# Patient Record
Sex: Female | Born: 1955 | Race: White | Hispanic: No | Marital: Married | Smoking: Never smoker
Health system: Southern US, Community
[De-identification: ages and names within clinical notes are randomized; demographics above are authoritative.]

## PROBLEM LIST (undated history)

## (undated) DIAGNOSIS — F410 Panic disorder [episodic paroxysmal anxiety] without agoraphobia: Secondary | ICD-10-CM

## (undated) DIAGNOSIS — E7800 Pure hypercholesterolemia, unspecified: Secondary | ICD-10-CM

## (undated) DIAGNOSIS — K2970 Gastritis, unspecified, without bleeding: Secondary | ICD-10-CM

## (undated) DIAGNOSIS — K449 Diaphragmatic hernia without obstruction or gangrene: Secondary | ICD-10-CM

## (undated) DIAGNOSIS — G35 Multiple sclerosis: Secondary | ICD-10-CM

## (undated) DIAGNOSIS — K5190 Ulcerative colitis, unspecified, without complications: Secondary | ICD-10-CM

## (undated) DIAGNOSIS — K648 Other hemorrhoids: Secondary | ICD-10-CM

## (undated) DIAGNOSIS — E162 Hypoglycemia, unspecified: Secondary | ICD-10-CM

## (undated) DIAGNOSIS — E669 Obesity, unspecified: Secondary | ICD-10-CM

## (undated) DIAGNOSIS — R109 Unspecified abdominal pain: Secondary | ICD-10-CM

## (undated) DIAGNOSIS — G35D Multiple sclerosis, unspecified: Secondary | ICD-10-CM

## (undated) HISTORY — DX: Obesity, unspecified: E66.9

## (undated) HISTORY — DX: Gastritis, unspecified, without bleeding: K29.70

## (undated) HISTORY — DX: Unspecified abdominal pain: R10.9

## (undated) HISTORY — DX: Diaphragmatic hernia without obstruction or gangrene: K44.9

## (undated) HISTORY — DX: Hypoglycemia, unspecified: E16.2

## (undated) HISTORY — PX: OTHER SURGICAL HISTORY: SHX169

## (undated) HISTORY — DX: Pure hypercholesterolemia, unspecified: E78.00

## (undated) HISTORY — DX: Panic disorder (episodic paroxysmal anxiety): F41.0

## (undated) HISTORY — PX: HYSTEROTOMY: SHX1776

## (undated) HISTORY — DX: Other hemorrhoids: K64.8

## (undated) HISTORY — DX: Ulcerative colitis, unspecified, without complications: K51.90

## (undated) HISTORY — DX: Multiple sclerosis, unspecified: G35.D

## (undated) HISTORY — PX: TONSILLECTOMY AND ADENOIDECTOMY: SHX28

## (undated) HISTORY — DX: Multiple sclerosis: G35

---

## 2011-02-19 HISTORY — PX: ROTATOR CUFF REPAIR: SHX139

## 2014-02-27 DIAGNOSIS — F419 Anxiety disorder, unspecified: Secondary | ICD-10-CM | POA: Diagnosis not present

## 2014-02-27 DIAGNOSIS — M5489 Other dorsalgia: Secondary | ICD-10-CM | POA: Diagnosis not present

## 2014-02-27 DIAGNOSIS — M546 Pain in thoracic spine: Secondary | ICD-10-CM | POA: Diagnosis not present

## 2014-02-28 ENCOUNTER — Encounter: Payer: Self-pay | Admitting: Neurology

## 2014-02-28 ENCOUNTER — Ambulatory Visit (INDEPENDENT_AMBULATORY_CARE_PROVIDER_SITE_OTHER): Payer: Medicare Other | Admitting: Neurology

## 2014-02-28 VITALS — BP 134/70 | HR 93 | Ht 64.0 in | Wt 198.0 lb

## 2014-02-28 DIAGNOSIS — G35 Multiple sclerosis: Secondary | ICD-10-CM

## 2014-02-28 NOTE — Patient Instructions (Addendum)
We will refer you to urology to evaluate and treat neurogenic bladder [facility]  [address] Sherian Maroon [place], [place] [address] [phone]  DR Annabell Howells   03/28/14 9:15am  We will send you to neuro-rehab for physical therapy  We will get notes from Dr. Merita Norton MRI of the brain and cervical spine with and without contrast [facility] 03/18/14 2:45 pm Follow up in 3 months.

## 2014-02-28 NOTE — Progress Notes (Signed)
NEUROLOGY CONSULTATION NOTE  Deborah Rangel MRN: [id number] DOB: 05/05/1955  Referring provider: Dr. Jeanie Sewer Primary care provider: Dr. Jeanie Sewer  Reason for consult:  MS  HISTORY OF PRESENT ILLNESS: Deborah Rangel is a 59 year old right-handed woman with depression, panic disorder, ulcerative colitis and hyperlipidemia who presents to establish care for secondary progressive multiple sclerosis.  She is accompanied by her husband who provides some history.  Records from her second neurologist reviewed.  Around 2006, she developed numbness in her mid to lower back.  She was evaluated by a neurologist at that time.  MRI of the brain and cervical spine were performed and reportedly revealed abnormal signal and enhancement.  A lumbar puncture was reportedly positive for oligoclonal bands.  Serum ANA was negative.  She was treated with IV Solu-Medrol, which treated the symptoms but caused steroid psychosis  She was started on Copaxone.  Over the next several years, she has had a gradual progression of symptoms.  She reports that sometimes there would be fluctuation in her foot weakness, but overall  without acute episodes of flare-up.  These symptoms include worsening gait.  For the past two years she is mostly in a wheelchair and rarely uses the walker.  She has both an overactive bladder and bladder retention.  She reports some mild fatigue.  She denies optic neuritis, muscle spasms, or neuropathic pain.  She discontinued Copaxone in 2012.    She currently takes sertraline 100mg  and lorazepam 0.5mg  for depression. She takes Ambien to help her sleep.  Ritalin and Nuvigil for fatigue were ineffective.  Ampyra for gait was ineffective.  PAST MEDICAL HISTORY: Past Medical History  Diagnosis Date  . Multiple sclerosis   . Hypoglycemia   . Obesity   . Mild chronic ulcerative colitis   . Panic disorder without agoraphobia   . Pure hypercholesterolemia     PAST SURGICAL HISTORY: Past Surgical History    Procedure Laterality Date  . Hysterotomy    . Arthroscopic knee surgery- left    . Lower back       MEDICATIONS: No current outpatient prescriptions on file prior to visit.   No current facility-administered medications on file prior to visit.    ALLERGIES: Allergies  Allergen Reactions  . Codeine Shortness Of Breath    FAMILY HISTORY: No family history on file.  SOCIAL HISTORY: History   Social History  . Marital Status: Married    Spouse Name: N/A    Number of Children: N/A  . Years of Education: N/A   Occupational History  . Not on file.   Social History Main Topics  . Smoking status: Never Smoker   . Smokeless tobacco: Not on file  . Alcohol Use: No  . Drug Use: No  . Sexual Activity: Not on file   Other Topics Concern  . Not on file   Social History Narrative  . No narrative on file    REVIEW OF SYSTEMS: Constitutional: No fevers, chills, or sweats, no generalized fatigue, change in appetite Eyes: No visual changes, double vision, eye pain Ear, nose and throat: No hearing loss, ear pain, nasal congestion, sore throat Cardiovascular: No chest pain, palpitations Respiratory:  No shortness of breath at rest or with exertion, wheezes GastrointestinaI: No nausea, vomiting, diarrhea, abdominal pain, fecal incontinence Genitourinary:  No dysuria, urinary retention or frequency Musculoskeletal:  No neck pain, back pain Integumentary: No rash, pruritus, skin lesions Neurological: as above Psychiatric: No depression, insomnia, anxiety Endocrine: No palpitations, fatigue, diaphoresis, mood  swings, change in appetite, change in weight, increased thirst Hematologic/Lymphatic:  No anemia, purpura, petechiae. Allergic/Immunologic: no itchy/runny eyes, nasal congestion, recent allergic reactions, rashes  PHYSICAL EXAM: Filed Vitals:   02/28/14 1312  BP: 134/70  Pulse: 93   General: No acute distress Head:  Normocephalic/atraumatic Eyes:  fundi  unremarkable, without vessel changes, exudates, hemorrhages or papilledema. Neck: supple, no paraspinal tenderness, full range of motion Back: No paraspinal tenderness Heart: regular rate and rhythm Lungs: Clear to auscultation bilaterally. Vascular: No carotid bruits. Neurological Exam: Mental status: alert and oriented to person, place, and time, recent and remote memory intact, fund of knowledge intact, attention and concentration intact, speech fluent and not dysarthric, language intact. Cranial nerves: CN I: not tested CN II: pupils equal, round and reactive to light, visual fields intact, fundi unremarkable, without vessel changes, exudates, hemorrhages or papilledema. CN III, IV, VI:  full range of motion, no nystagmus, no ptosis CN V: facial sensation intact CN VII: upper and lower face symmetric CN VIII: hearing intact CN IX, X: gag intact, uvula midline CN XI: sternocleidomastoid and trapezius muscles intact CN XII: tongue midline Bulk & Tone: mildly increased in the legs. Motor:  3-/5 right hip flexion, 2+/5 left hip flexion, 5-/5 right hamstring, 4+/5 left hamstring, 2-3/5 quads, 2/5 left ankle dorsiflexion.  Otherwise 5/5. Sensation:  Reduced pinprick sensation on dorsum of left foot.  Vibration sensation intact. Deep Tendon Reflexes:  2+ in upper extremities, 3+ in lower extremities (greater on the left), bilateral Babinski Finger to nose testing:  No dysmetria Heel to shin:  Cannot perform Gait:  Cannot stand without assistance.  IMPRESSION: Secondary progressive MS (although this may be primary progressive MS, given that she doesn't seem to have any history of recurrent flare-ups). Neurogenic bladder  PLAN: There are no disease-modifying agents to treat her MS.  Supportive care. 1.  Refer to neurorehab 2.  Refer to urology 3.  Continue exercises at home 4.  MRI of brain and cervical spine with and without contrast 5.  Obtain records from first neurologist, Dr.  Merita Norton 6.  Follow up in 3 months.  Thank you for allowing me to take part in the care of this patient.  Shon Millet, DO  CC:  Gwendlyn Deutscher, MD

## 2014-03-02 DIAGNOSIS — Z0000 Encounter for general adult medical examination without abnormal findings: Secondary | ICD-10-CM | POA: Diagnosis not present

## 2014-03-18 ENCOUNTER — Ambulatory Visit (HOSPITAL_COMMUNITY): Admission: RE | Admit: 2014-03-18 | Payer: Medicare Other | Source: Ambulatory Visit

## 2014-03-18 ENCOUNTER — Ambulatory Visit (HOSPITAL_COMMUNITY)
Admission: RE | Admit: 2014-03-18 | Discharge: 2014-03-18 | Disposition: A | Discharge status: Home / Self Care | Payer: Medicare Other | Source: Ambulatory Visit | Attending: Neurology | Admitting: Neurology

## 2014-03-18 DIAGNOSIS — G35 Multiple sclerosis: Secondary | ICD-10-CM

## 2014-03-18 DIAGNOSIS — G9589 Other specified diseases of spinal cord: Secondary | ICD-10-CM | POA: Diagnosis not present

## 2014-03-18 DIAGNOSIS — G9389 Other specified disorders of brain: Secondary | ICD-10-CM | POA: Diagnosis not present

## 2014-03-18 MED ORDER — GADOBENATE DIMEGLUMINE 529 MG/ML IV SOLN
20.0000 mL | Freq: Once | INTRAVENOUS | Status: AC | PRN
  Administered 2014-03-18: 20 mL via INTRAVENOUS

## 2014-04-11 DIAGNOSIS — N3941 Urge incontinence: Secondary | ICD-10-CM | POA: Diagnosis not present

## 2014-04-11 DIAGNOSIS — R3911 Hesitancy of micturition: Secondary | ICD-10-CM | POA: Diagnosis not present

## 2014-04-11 DIAGNOSIS — R3916 Straining to void: Secondary | ICD-10-CM | POA: Diagnosis not present

## 2014-04-11 DIAGNOSIS — N319 Neuromuscular dysfunction of bladder, unspecified: Secondary | ICD-10-CM | POA: Diagnosis not present

## 2014-04-12 DIAGNOSIS — F410 Panic disorder [episodic paroxysmal anxiety] without agoraphobia: Secondary | ICD-10-CM | POA: Diagnosis not present

## 2014-04-12 DIAGNOSIS — E780 Pure hypercholesterolemia: Secondary | ICD-10-CM | POA: Diagnosis not present

## 2014-04-28 DIAGNOSIS — J40 Bronchitis, not specified as acute or chronic: Secondary | ICD-10-CM | POA: Diagnosis not present

## 2014-06-01 ENCOUNTER — Encounter: Payer: Self-pay | Admitting: Neurology

## 2014-06-01 ENCOUNTER — Ambulatory Visit (INDEPENDENT_AMBULATORY_CARE_PROVIDER_SITE_OTHER): Payer: Medicare Other | Admitting: Neurology

## 2014-06-01 VITALS — BP 106/70 | HR 100 | Resp 22 | Ht 64.0 in

## 2014-06-01 DIAGNOSIS — F411 Generalized anxiety disorder: Secondary | ICD-10-CM

## 2014-06-01 DIAGNOSIS — G35 Multiple sclerosis: Secondary | ICD-10-CM

## 2014-06-01 DIAGNOSIS — N319 Neuromuscular dysfunction of bladder, unspecified: Secondary | ICD-10-CM | POA: Diagnosis not present

## 2014-06-01 NOTE — Patient Instructions (Signed)
The main thing would be to get the anxiety under control.  Once that is done, contact me and we can refer you for another round of physical therapy.  Follow up with the urologist.  Follow up with me in 6 months but call with any questions or concerns.

## 2014-06-01 NOTE — Progress Notes (Signed)
NEUROLOGY FOLLOW UP OFFICE NOTE  Deborah Rangel [id number]  HISTORY OF PRESENT ILLNESS: Deborah Rangel is a 59 year old right-handed woman with depression, panic disorder, ulcerative colitis and hyperlipidemia who follows up for secondary progressive multiple sclerosis.  She is accompanied by her husband who provides some history.  MRI of the brain reviewed.    UPDATE: MRI of the brain with and without contrast performed in January showed confluent non-enhancing T2 hyperintense lesions, including the cerebellar peduncle, right thalamus and splenium of corpus callosum.  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  Physically, she feels it is a little more difficult to maneuver out of her wheelchair.  She has exercises she does at home, but does not do them as frequently as she should.  Her main issue is her anxiety, which prevents her from doing things, such as exercise.  I recommended another round of PT, but she says she would unlikely be able to participate while she has such high anxiety.  She will address this with her psychiatrist.  She missed her appointment with the urologist but will reschedule.  HISTORY: Around 2006, she developed numbness in her mid to lower back.  She was evaluated by a neurologist at that time.  MRI of the brain and cervical spine were performed and reportedly revealed abnormal signal and enhancement.  A lumbar puncture was reportedly positive for oligoclonal bands.  Serum ANA was negative.  She was treated with IV Solu-Medrol, which treated the symptoms but caused steroid psychosis  She was started on Copaxone.  Over the next several years, she has had a gradual progression of symptoms.  She reports that sometimes there would be fluctuation in her foot weakness, but overall  without acute episodes of flare-up.  These symptoms include worsening gait.  For the past two years she is mostly in a wheelchair and rarely uses the walker.   She has both an overactive bladder and bladder retention.  She reports some mild fatigue.  She denies optic neuritis, muscle spasms, or neuropathic pain.  She discontinued Copaxone in 2012.    She currently takes sertraline 100mg  and lorazepam 0.5mg  for depression. She takes Ambien to help her sleep.  Ritalin and Nuvigil for fatigue were ineffective.  Ampyra for gait was ineffective.  PAST MEDICAL HISTORY: Past Medical History  Diagnosis Date  . Multiple sclerosis   . Hypoglycemia   . Obesity   . Mild chronic ulcerative colitis   . Panic disorder without agoraphobia   . Pure hypercholesterolemia     MEDICATIONS: Current Outpatient Prescriptions on File Prior to Visit  Medication Sig Dispense Refill  . LIALDA 1.2 G EC tablet Take 2.4 g by mouth 2 (two) times daily.  2  . LORazepam (ATIVAN) 0.5 MG tablet Take 0.5 mg by mouth 2 (two) times daily as needed.  0  . pravastatin (PRAVACHOL) 40 MG tablet Take 40 mg by mouth at bedtime.  0  . sertraline (ZOLOFT) 100 MG tablet Take 200 mg by mouth daily.  0  . zolpidem (AMBIEN) 10 MG tablet Take 10 mg by mouth at bedtime as needed. for sleep  1   No current facility-administered medications on file prior to visit.    ALLERGIES: Allergies  Allergen Reactions  . Codeine Shortness Of Breath    FAMILY HISTORY: No family history on file.  SOCIAL HISTORY: History   Social History  . Marital Status: Married    Spouse Name:  N/A  . Number of Children: N/A  . Years of Education: N/A   Occupational History  . Not on file.   Social History Main Topics  . Smoking status: Never Smoker   . Smokeless tobacco: Not on file  . Alcohol Use: No  . Drug Use: No  . Sexual Activity:    Partners: Male   Other Topics Concern  . Not on file   Social History Narrative    REVIEW OF SYSTEMS: Constitutional: No fevers, chills, or sweats, no generalized fatigue, change in appetite Eyes: No visual changes, double vision, eye pain Ear, nose and  throat: No hearing loss, ear pain, nasal congestion, sore throat Cardiovascular: No chest pain, palpitations Respiratory:  No shortness of breath at rest or with exertion, wheezes GastrointestinaI: No nausea, vomiting, diarrhea, abdominal pain, fecal incontinence Genitourinary:  Neurogenic bladder Musculoskeletal:  No neck pain, back pain Integumentary: No rash, pruritus, skin lesions Neurological: as above Psychiatric: anxiety, depression, insomnia Endocrine: No palpitations, fatigue, diaphoresis, mood swings, change in appetite, change in weight, increased thirst Hematologic/Lymphatic:  No anemia, purpura, petechiae. Allergic/Immunologic: no itchy/runny eyes, nasal congestion, recent allergic reactions, rashes  PHYSICAL EXAM: Filed Vitals:   06/01/14 1144  BP: 106/70  Pulse: 100  Resp: 22   General: No acute distress Head:  Normocephalic/atraumatic Eyes:  Fundoscopic exam unremarkable without vessel changes, exudates, hemorrhages or papilledema. Neck: supple, no paraspinal tenderness, full range of motion Heart:  Regular rate and rhythm Lungs:  Clear to auscultation bilaterally Back: No paraspinal tenderness Neurological Exam: alert and oriented to person, place, and time, recent and remote memory intact, fund of knowledge intact, attention and concentration intact, speech fluent and not dysarthric, language intact.  CN II-XII intact.  Increased tone in the legs.  Muscle strength 3-/5 right hip flexion, 2+/5 left hip flexion, 5-/5 right hamstring, 4+/5 left hamstring, 2-3/5 quads, 2/5 left ankle dorsiflexion.  Otherwise 5/5.  Mildly reduced pinprick and vibration sensation in feet.  2+ in upper extremities, 3+ in lower extremities (greater on the left), bilateral Babinski.  Cannot stand without assistance.    IMPRESSION: Secondary progressive MS (although this may be primary progressive MS, given that she doesn't seem to have any history of recurrent flare-ups). Anxiety Neurogenic  bladder  PLAN: Will follow up with psychiatry.  Once anxiety is better controlled, she will be referred for another round of PT She will reschedule with urology Follow up in 6 months.  25 minutes spent with patient, over 50% spent discussing management  Shon Millet, DO  CC: Gwendlyn Deutscher, MD

## 2014-07-04 ENCOUNTER — Telehealth: Payer: Self-pay | Admitting: Neurology

## 2014-07-04 NOTE — Telephone Encounter (Signed)
Pt wants physical therapy. Please call at [phone] / Sherri S.

## 2014-07-06 ENCOUNTER — Other Ambulatory Visit: Payer: Self-pay | Admitting: *Deleted

## 2014-07-06 DIAGNOSIS — G35 Multiple sclerosis: Secondary | ICD-10-CM

## 2014-07-06 NOTE — Telephone Encounter (Signed)
Patient follow up on PT Call back number [phone]

## 2014-07-06 NOTE — Telephone Encounter (Signed)
I spoke with Shanda Bumps at [facility] gave information they will call me back regarding PT for this patient  I also spoke with patient she does have a Medicare care as well and [facility] [address]

## 2014-07-07 DIAGNOSIS — F411 Generalized anxiety disorder: Secondary | ICD-10-CM | POA: Diagnosis not present

## 2014-07-07 DIAGNOSIS — N319 Neuromuscular dysfunction of bladder, unspecified: Secondary | ICD-10-CM | POA: Diagnosis not present

## 2014-07-07 DIAGNOSIS — G35 Multiple sclerosis: Secondary | ICD-10-CM | POA: Diagnosis not present

## 2014-07-11 DIAGNOSIS — F411 Generalized anxiety disorder: Secondary | ICD-10-CM | POA: Diagnosis not present

## 2014-07-11 DIAGNOSIS — N319 Neuromuscular dysfunction of bladder, unspecified: Secondary | ICD-10-CM | POA: Diagnosis not present

## 2014-07-11 DIAGNOSIS — G35 Multiple sclerosis: Secondary | ICD-10-CM | POA: Diagnosis not present

## 2014-07-12 DIAGNOSIS — N319 Neuromuscular dysfunction of bladder, unspecified: Secondary | ICD-10-CM | POA: Diagnosis not present

## 2014-07-12 DIAGNOSIS — F411 Generalized anxiety disorder: Secondary | ICD-10-CM | POA: Diagnosis not present

## 2014-07-12 DIAGNOSIS — G35 Multiple sclerosis: Secondary | ICD-10-CM | POA: Diagnosis not present

## 2014-07-13 DIAGNOSIS — F411 Generalized anxiety disorder: Secondary | ICD-10-CM | POA: Diagnosis not present

## 2014-07-13 DIAGNOSIS — N319 Neuromuscular dysfunction of bladder, unspecified: Secondary | ICD-10-CM | POA: Diagnosis not present

## 2014-07-13 DIAGNOSIS — G35 Multiple sclerosis: Secondary | ICD-10-CM | POA: Diagnosis not present

## 2014-07-15 DIAGNOSIS — G35 Multiple sclerosis: Secondary | ICD-10-CM | POA: Diagnosis not present

## 2014-07-15 DIAGNOSIS — N319 Neuromuscular dysfunction of bladder, unspecified: Secondary | ICD-10-CM | POA: Diagnosis not present

## 2014-07-15 DIAGNOSIS — F411 Generalized anxiety disorder: Secondary | ICD-10-CM | POA: Diagnosis not present

## 2014-07-15 DIAGNOSIS — R3916 Straining to void: Secondary | ICD-10-CM | POA: Diagnosis not present

## 2014-07-15 DIAGNOSIS — N3941 Urge incontinence: Secondary | ICD-10-CM | POA: Diagnosis not present

## 2014-07-18 DIAGNOSIS — N319 Neuromuscular dysfunction of bladder, unspecified: Secondary | ICD-10-CM | POA: Diagnosis not present

## 2014-07-18 DIAGNOSIS — F411 Generalized anxiety disorder: Secondary | ICD-10-CM | POA: Diagnosis not present

## 2014-07-18 DIAGNOSIS — G35 Multiple sclerosis: Secondary | ICD-10-CM | POA: Diagnosis not present

## 2014-07-20 DIAGNOSIS — N312 Flaccid neuropathic bladder, not elsewhere classified: Secondary | ICD-10-CM | POA: Diagnosis not present

## 2014-07-20 DIAGNOSIS — G35 Multiple sclerosis: Secondary | ICD-10-CM | POA: Diagnosis not present

## 2014-07-20 DIAGNOSIS — R3916 Straining to void: Secondary | ICD-10-CM | POA: Diagnosis not present

## 2014-07-20 DIAGNOSIS — N319 Neuromuscular dysfunction of bladder, unspecified: Secondary | ICD-10-CM | POA: Diagnosis not present

## 2014-07-20 DIAGNOSIS — F411 Generalized anxiety disorder: Secondary | ICD-10-CM | POA: Diagnosis not present

## 2014-07-20 DIAGNOSIS — N3941 Urge incontinence: Secondary | ICD-10-CM | POA: Diagnosis not present

## 2014-07-22 DIAGNOSIS — F411 Generalized anxiety disorder: Secondary | ICD-10-CM | POA: Diagnosis not present

## 2014-07-22 DIAGNOSIS — N319 Neuromuscular dysfunction of bladder, unspecified: Secondary | ICD-10-CM | POA: Diagnosis not present

## 2014-07-22 DIAGNOSIS — G35 Multiple sclerosis: Secondary | ICD-10-CM | POA: Diagnosis not present

## 2014-07-25 DIAGNOSIS — N319 Neuromuscular dysfunction of bladder, unspecified: Secondary | ICD-10-CM | POA: Diagnosis not present

## 2014-07-25 DIAGNOSIS — G35 Multiple sclerosis: Secondary | ICD-10-CM | POA: Diagnosis not present

## 2014-07-25 DIAGNOSIS — M1990 Unspecified osteoarthritis, unspecified site: Secondary | ICD-10-CM | POA: Diagnosis not present

## 2014-07-25 DIAGNOSIS — F411 Generalized anxiety disorder: Secondary | ICD-10-CM | POA: Diagnosis not present

## 2014-07-25 DIAGNOSIS — R600 Localized edema: Secondary | ICD-10-CM | POA: Diagnosis not present

## 2014-07-27 DIAGNOSIS — G35 Multiple sclerosis: Secondary | ICD-10-CM | POA: Diagnosis not present

## 2014-07-27 DIAGNOSIS — N319 Neuromuscular dysfunction of bladder, unspecified: Secondary | ICD-10-CM | POA: Diagnosis not present

## 2014-07-27 DIAGNOSIS — F411 Generalized anxiety disorder: Secondary | ICD-10-CM | POA: Diagnosis not present

## 2014-07-29 DIAGNOSIS — M7989 Other specified soft tissue disorders: Secondary | ICD-10-CM | POA: Diagnosis not present

## 2014-07-29 DIAGNOSIS — I82409 Acute embolism and thrombosis of unspecified deep veins of unspecified lower extremity: Secondary | ICD-10-CM | POA: Diagnosis not present

## 2014-08-01 DIAGNOSIS — G35 Multiple sclerosis: Secondary | ICD-10-CM | POA: Diagnosis not present

## 2014-08-01 DIAGNOSIS — F411 Generalized anxiety disorder: Secondary | ICD-10-CM | POA: Diagnosis not present

## 2014-08-01 DIAGNOSIS — N319 Neuromuscular dysfunction of bladder, unspecified: Secondary | ICD-10-CM | POA: Diagnosis not present

## 2014-08-03 DIAGNOSIS — G35 Multiple sclerosis: Secondary | ICD-10-CM | POA: Diagnosis not present

## 2014-08-03 DIAGNOSIS — N319 Neuromuscular dysfunction of bladder, unspecified: Secondary | ICD-10-CM | POA: Diagnosis not present

## 2014-08-03 DIAGNOSIS — F411 Generalized anxiety disorder: Secondary | ICD-10-CM | POA: Diagnosis not present

## 2014-08-05 DIAGNOSIS — F411 Generalized anxiety disorder: Secondary | ICD-10-CM | POA: Diagnosis not present

## 2014-08-05 DIAGNOSIS — N319 Neuromuscular dysfunction of bladder, unspecified: Secondary | ICD-10-CM | POA: Diagnosis not present

## 2014-08-05 DIAGNOSIS — G35 Multiple sclerosis: Secondary | ICD-10-CM | POA: Diagnosis not present

## 2014-08-08 DIAGNOSIS — A009 Cholera, unspecified: Secondary | ICD-10-CM | POA: Diagnosis not present

## 2014-08-08 DIAGNOSIS — K5190 Ulcerative colitis, unspecified, without complications: Secondary | ICD-10-CM | POA: Diagnosis not present

## 2014-08-11 DIAGNOSIS — G35 Multiple sclerosis: Secondary | ICD-10-CM | POA: Diagnosis not present

## 2014-08-11 DIAGNOSIS — F411 Generalized anxiety disorder: Secondary | ICD-10-CM | POA: Diagnosis not present

## 2014-08-11 DIAGNOSIS — N319 Neuromuscular dysfunction of bladder, unspecified: Secondary | ICD-10-CM | POA: Diagnosis not present

## 2014-08-12 DIAGNOSIS — N319 Neuromuscular dysfunction of bladder, unspecified: Secondary | ICD-10-CM | POA: Diagnosis not present

## 2014-08-12 DIAGNOSIS — G35 Multiple sclerosis: Secondary | ICD-10-CM | POA: Diagnosis not present

## 2014-08-12 DIAGNOSIS — F411 Generalized anxiety disorder: Secondary | ICD-10-CM | POA: Diagnosis not present

## 2014-08-15 DIAGNOSIS — M069 Rheumatoid arthritis, unspecified: Secondary | ICD-10-CM | POA: Diagnosis not present

## 2014-08-30 DIAGNOSIS — K5190 Ulcerative colitis, unspecified, without complications: Secondary | ICD-10-CM | POA: Diagnosis not present

## 2014-09-02 DIAGNOSIS — F411 Generalized anxiety disorder: Secondary | ICD-10-CM | POA: Diagnosis not present

## 2014-09-02 DIAGNOSIS — N319 Neuromuscular dysfunction of bladder, unspecified: Secondary | ICD-10-CM | POA: Diagnosis not present

## 2014-09-02 DIAGNOSIS — G35 Multiple sclerosis: Secondary | ICD-10-CM | POA: Diagnosis not present

## 2014-09-06 DIAGNOSIS — M6281 Muscle weakness (generalized): Secondary | ICD-10-CM | POA: Diagnosis not present

## 2014-09-06 DIAGNOSIS — F411 Generalized anxiety disorder: Secondary | ICD-10-CM | POA: Diagnosis not present

## 2014-09-06 DIAGNOSIS — N319 Neuromuscular dysfunction of bladder, unspecified: Secondary | ICD-10-CM | POA: Diagnosis not present

## 2014-09-06 DIAGNOSIS — G35 Multiple sclerosis: Secondary | ICD-10-CM | POA: Diagnosis not present

## 2014-09-14 DIAGNOSIS — M21372 Foot drop, left foot: Secondary | ICD-10-CM | POA: Diagnosis not present

## 2014-09-15 DIAGNOSIS — F411 Generalized anxiety disorder: Secondary | ICD-10-CM | POA: Diagnosis not present

## 2014-09-15 DIAGNOSIS — N319 Neuromuscular dysfunction of bladder, unspecified: Secondary | ICD-10-CM | POA: Diagnosis not present

## 2014-09-15 DIAGNOSIS — M6281 Muscle weakness (generalized): Secondary | ICD-10-CM | POA: Diagnosis not present

## 2014-09-15 DIAGNOSIS — G35 Multiple sclerosis: Secondary | ICD-10-CM | POA: Diagnosis not present

## 2014-09-19 DIAGNOSIS — M6281 Muscle weakness (generalized): Secondary | ICD-10-CM | POA: Diagnosis not present

## 2014-09-19 DIAGNOSIS — G35 Multiple sclerosis: Secondary | ICD-10-CM | POA: Diagnosis not present

## 2014-09-19 DIAGNOSIS — F411 Generalized anxiety disorder: Secondary | ICD-10-CM | POA: Diagnosis not present

## 2014-09-19 DIAGNOSIS — N319 Neuromuscular dysfunction of bladder, unspecified: Secondary | ICD-10-CM | POA: Diagnosis not present

## 2014-09-20 DIAGNOSIS — Z1231 Encounter for screening mammogram for malignant neoplasm of breast: Secondary | ICD-10-CM | POA: Diagnosis not present

## 2014-09-21 DIAGNOSIS — M6281 Muscle weakness (generalized): Secondary | ICD-10-CM | POA: Diagnosis not present

## 2014-09-21 DIAGNOSIS — G35 Multiple sclerosis: Secondary | ICD-10-CM | POA: Diagnosis not present

## 2014-09-21 DIAGNOSIS — F411 Generalized anxiety disorder: Secondary | ICD-10-CM | POA: Diagnosis not present

## 2014-09-21 DIAGNOSIS — N319 Neuromuscular dysfunction of bladder, unspecified: Secondary | ICD-10-CM | POA: Diagnosis not present

## 2014-09-23 DIAGNOSIS — N319 Neuromuscular dysfunction of bladder, unspecified: Secondary | ICD-10-CM | POA: Diagnosis not present

## 2014-09-23 DIAGNOSIS — M6281 Muscle weakness (generalized): Secondary | ICD-10-CM | POA: Diagnosis not present

## 2014-09-23 DIAGNOSIS — F411 Generalized anxiety disorder: Secondary | ICD-10-CM | POA: Diagnosis not present

## 2014-09-23 DIAGNOSIS — G35 Multiple sclerosis: Secondary | ICD-10-CM | POA: Diagnosis not present

## 2014-09-26 DIAGNOSIS — M25572 Pain in left ankle and joints of left foot: Secondary | ICD-10-CM | POA: Diagnosis not present

## 2014-09-26 DIAGNOSIS — M2550 Pain in unspecified joint: Secondary | ICD-10-CM | POA: Diagnosis not present

## 2014-09-26 DIAGNOSIS — K5180 Other ulcerative colitis without complications: Secondary | ICD-10-CM | POA: Diagnosis not present

## 2014-09-26 DIAGNOSIS — G35 Multiple sclerosis: Secondary | ICD-10-CM | POA: Diagnosis not present

## 2014-09-26 DIAGNOSIS — M25571 Pain in right ankle and joints of right foot: Secondary | ICD-10-CM | POA: Diagnosis not present

## 2014-09-29 DIAGNOSIS — F411 Generalized anxiety disorder: Secondary | ICD-10-CM | POA: Diagnosis not present

## 2014-09-29 DIAGNOSIS — M6281 Muscle weakness (generalized): Secondary | ICD-10-CM | POA: Diagnosis not present

## 2014-09-29 DIAGNOSIS — G35 Multiple sclerosis: Secondary | ICD-10-CM | POA: Diagnosis not present

## 2014-09-29 DIAGNOSIS — N319 Neuromuscular dysfunction of bladder, unspecified: Secondary | ICD-10-CM | POA: Diagnosis not present

## 2014-09-30 DIAGNOSIS — N319 Neuromuscular dysfunction of bladder, unspecified: Secondary | ICD-10-CM | POA: Diagnosis not present

## 2014-09-30 DIAGNOSIS — F411 Generalized anxiety disorder: Secondary | ICD-10-CM | POA: Diagnosis not present

## 2014-09-30 DIAGNOSIS — M6281 Muscle weakness (generalized): Secondary | ICD-10-CM | POA: Diagnosis not present

## 2014-09-30 DIAGNOSIS — G35 Multiple sclerosis: Secondary | ICD-10-CM | POA: Diagnosis not present

## 2014-10-04 DIAGNOSIS — N319 Neuromuscular dysfunction of bladder, unspecified: Secondary | ICD-10-CM | POA: Diagnosis not present

## 2014-10-04 DIAGNOSIS — M6281 Muscle weakness (generalized): Secondary | ICD-10-CM | POA: Diagnosis not present

## 2014-10-04 DIAGNOSIS — F411 Generalized anxiety disorder: Secondary | ICD-10-CM | POA: Diagnosis not present

## 2014-10-04 DIAGNOSIS — G35 Multiple sclerosis: Secondary | ICD-10-CM | POA: Diagnosis not present

## 2014-10-06 DIAGNOSIS — F411 Generalized anxiety disorder: Secondary | ICD-10-CM | POA: Diagnosis not present

## 2014-10-06 DIAGNOSIS — G35 Multiple sclerosis: Secondary | ICD-10-CM | POA: Diagnosis not present

## 2014-10-06 DIAGNOSIS — M6281 Muscle weakness (generalized): Secondary | ICD-10-CM | POA: Diagnosis not present

## 2014-10-06 DIAGNOSIS — N319 Neuromuscular dysfunction of bladder, unspecified: Secondary | ICD-10-CM | POA: Diagnosis not present

## 2014-10-07 DIAGNOSIS — Z0279 Encounter for issue of other medical certificate: Secondary | ICD-10-CM

## 2014-10-11 DIAGNOSIS — N319 Neuromuscular dysfunction of bladder, unspecified: Secondary | ICD-10-CM | POA: Diagnosis not present

## 2014-10-11 DIAGNOSIS — G35 Multiple sclerosis: Secondary | ICD-10-CM | POA: Diagnosis not present

## 2014-10-11 DIAGNOSIS — M6281 Muscle weakness (generalized): Secondary | ICD-10-CM | POA: Diagnosis not present

## 2014-10-11 DIAGNOSIS — F411 Generalized anxiety disorder: Secondary | ICD-10-CM | POA: Diagnosis not present

## 2014-10-13 DIAGNOSIS — M6281 Muscle weakness (generalized): Secondary | ICD-10-CM | POA: Diagnosis not present

## 2014-10-13 DIAGNOSIS — F411 Generalized anxiety disorder: Secondary | ICD-10-CM | POA: Diagnosis not present

## 2014-10-13 DIAGNOSIS — G35 Multiple sclerosis: Secondary | ICD-10-CM | POA: Diagnosis not present

## 2014-10-13 DIAGNOSIS — N319 Neuromuscular dysfunction of bladder, unspecified: Secondary | ICD-10-CM | POA: Diagnosis not present

## 2014-10-17 DIAGNOSIS — M25572 Pain in left ankle and joints of left foot: Secondary | ICD-10-CM | POA: Diagnosis not present

## 2014-10-17 DIAGNOSIS — G35 Multiple sclerosis: Secondary | ICD-10-CM | POA: Diagnosis not present

## 2014-10-17 DIAGNOSIS — M2550 Pain in unspecified joint: Secondary | ICD-10-CM | POA: Diagnosis not present

## 2014-10-17 DIAGNOSIS — K5180 Other ulcerative colitis without complications: Secondary | ICD-10-CM | POA: Diagnosis not present

## 2014-10-25 ENCOUNTER — Telehealth: Payer: Self-pay | Admitting: Neurology

## 2014-10-25 NOTE — Telephone Encounter (Signed)
Tried calling patient. No answer. Looked for forms multiple places, unable to find. Per Dr. Everlena Cooper She will have to resubmit forms.

## 2014-10-25 NOTE — Telephone Encounter (Signed)
Pt/ Deborah Rangel/ DOB: February 26, 2055/ called about DMV documentation wasn't completed/ per DMV/ stated that they were re-faxed last week/ wanted to know were they completed?/ Call back @ [phone]

## 2014-10-25 NOTE — Telephone Encounter (Signed)
Send to [facility] for review.

## 2014-10-26 ENCOUNTER — Telehealth: Payer: Self-pay | Admitting: *Deleted

## 2014-10-26 NOTE — Telephone Encounter (Signed)
Patient called she would like to be called when her paper work is ready for pick up Call back number [phone]

## 2014-10-26 NOTE — Telephone Encounter (Signed)
Spoke with pt. Pt. States that she is going to call the [facility] and have them re-fax her forms to [place]. I explained that I was unable to find the forms. Patient voiced understanding.

## 2014-10-28 ENCOUNTER — Telehealth: Payer: Self-pay | Admitting: Neurology

## 2014-10-28 NOTE — Telephone Encounter (Signed)
Lm informing patient that I have faxed forms, wasn't sure if she also wanted to pickup a copy of the forms as well.

## 2014-10-28 NOTE — Telephone Encounter (Signed)
Pt called to check on paperwork/ if it was ready yet?/ Call back @ [phone]

## 2014-11-01 NOTE — Telephone Encounter (Signed)
Spoke with pt. Pt. Advised that paper work faxed over. [facility]

## 2014-11-02 ENCOUNTER — Telehealth: Payer: Self-pay | Admitting: Neurology

## 2014-11-02 NOTE — Telephone Encounter (Signed)
Pt called and said we faxed some paperwork to [facility] regarding drivers license and they received it all but page #2 and that needs to be refaxed/Dawn CB# [phone]

## 2014-11-02 NOTE — Telephone Encounter (Signed)
Spoke with pt husband. Advised that Dr. Everlena Cooper filled out page 2 and we have faxed back to [facility]. Patient will call with any other questions. [facility]

## 2014-11-02 NOTE — Telephone Encounter (Signed)
Pt called back and need to have you call her back in regards to her paperwork/Dawn CB# [phone]

## 2014-11-21 DIAGNOSIS — G35 Multiple sclerosis: Secondary | ICD-10-CM | POA: Diagnosis not present

## 2014-11-21 DIAGNOSIS — N3941 Urge incontinence: Secondary | ICD-10-CM | POA: Diagnosis not present

## 2014-11-21 DIAGNOSIS — N319 Neuromuscular dysfunction of bladder, unspecified: Secondary | ICD-10-CM | POA: Diagnosis not present

## 2014-11-21 DIAGNOSIS — R3916 Straining to void: Secondary | ICD-10-CM | POA: Diagnosis not present

## 2014-12-02 ENCOUNTER — Encounter: Payer: Self-pay | Admitting: Neurology

## 2014-12-02 ENCOUNTER — Ambulatory Visit (INDEPENDENT_AMBULATORY_CARE_PROVIDER_SITE_OTHER): Payer: Medicare Other | Admitting: Neurology

## 2014-12-02 VITALS — BP 120/68 | HR 104 | Wt 185.0 lb

## 2014-12-02 DIAGNOSIS — G35 Multiple sclerosis: Secondary | ICD-10-CM | POA: Diagnosis not present

## 2014-12-02 DIAGNOSIS — F411 Generalized anxiety disorder: Secondary | ICD-10-CM | POA: Diagnosis not present

## 2014-12-02 DIAGNOSIS — N319 Neuromuscular dysfunction of bladder, unspecified: Secondary | ICD-10-CM | POA: Diagnosis not present

## 2014-12-02 NOTE — Patient Instructions (Signed)
Follow up in 9 months

## 2014-12-02 NOTE — Progress Notes (Signed)
NEUROLOGY FOLLOW UP OFFICE NOTE  Deborah Rangel [id number]  HISTORY OF PRESENT ILLNESS: Deborah Rangel is a 59 year old right-handed woman with depression, panic disorder, ulcerative colitis and hyperlipidemia who follows up for secondary progressive multiple sclerosis.  She is accompanied by her husband who provides some history.    UPDATE: She is doing well.  She actually is able to take a few steps with her walker when walking down the driveway (as long as her husband is behind her with the wheelchair).  Sometimes she notes muscle spasms, but nothing painful.  HISTORY: Around 2006, she developed numbness in her mid to lower back.  She was evaluated by a neurologist at that time.  MRI of the brain and cervical spine were performed and reportedly revealed abnormal signal and enhancement.  A lumbar puncture was reportedly positive for oligoclonal bands.  Serum ANA was negative.  She was treated with IV Solu-Medrol, which treated the symptoms but caused steroid psychosis  She was started on Copaxone.  Over the next several years, she has had a gradual progression of symptoms.  She reports that sometimes there would be fluctuation in her foot weakness, but overall  without acute episodes of flare-up.  These symptoms include worsening gait.  For the past two years she is mostly in a wheelchair and rarely uses the walker.  She has both an overactive bladder and bladder retention.  She reports some mild fatigue.  She denies optic neuritis, muscle spasms, or neuropathic pain.  She discontinued Copaxone in 2012.    She currently takes sertraline 100mg  and lorazepam 0.5mg  for depression. She takes Ambien to help her sleep.  Ritalin and Nuvigil for fatigue were ineffective.  Ampyra for gait was ineffective.  She also has neurogenic bladder and is followed by Dr. at [facility].  MRI of the brain with and without contrast performed in January showed confluent non-enhancing T2  hyperintense lesions, including the cerebellar peduncle, right thalamus and splenium of corpus callosum.  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  PAST MEDICAL HISTORY: Past Medical History  Diagnosis Date  . Multiple sclerosis (HCC)   . Hypoglycemia   . Obesity   . Mild chronic ulcerative colitis (HCC)   . Panic disorder without agoraphobia   . Pure hypercholesterolemia     MEDICATIONS: Current Outpatient Prescriptions on File Prior to Visit  Medication Sig Dispense Refill  . gabapentin (NEURONTIN) 100 MG capsule Take 100 mg by mouth at bedtime.  0  . LIALDA 1.2 G EC tablet Take 2.4 g by mouth 2 (two) times daily.  2  . LORazepam (ATIVAN) 0.5 MG tablet Take 0.5 mg by mouth 2 (two) times daily as needed.  0  . mirtazapine (REMERON) 15 MG tablet   0  . pravastatin (PRAVACHOL) 40 MG tablet Take 40 mg by mouth at bedtime.  0  . sertraline (ZOLOFT) 100 MG tablet Take 200 mg by mouth daily.  0  . zolpidem (AMBIEN) 10 MG tablet Take 10 mg by mouth at bedtime as needed. for sleep  1   No current facility-administered medications on file prior to visit.    ALLERGIES: Allergies  Allergen Reactions  . Codeine Shortness Of Breath    FAMILY HISTORY: History reviewed. No pertinent family history.  SOCIAL HISTORY: Social History   Social History  . Marital Status: Married    Spouse Name: N/A  . Number of Children: N/A  .  Years of Education: N/A   Occupational History  . Not on file.   Social History Main Topics  . Smoking status: Never Smoker   . Smokeless tobacco: Not on file  . Alcohol Use: No  . Drug Use: No  . Sexual Activity:    Partners: Male   Other Topics Concern  . Not on file   Social History Narrative    REVIEW OF SYSTEMS: Constitutional: No fevers, chills, or sweats, no generalized fatigue, change in appetite Eyes: No visual changes, double vision, eye pain Ear, nose and throat: No hearing loss, ear pain,  nasal congestion, sore throat Cardiovascular: No chest pain, palpitations Respiratory:  No shortness of breath at rest or with exertion, wheezes GastrointestinaI: No nausea, vomiting, diarrhea, abdominal pain, fecal incontinence Genitourinary:  Neurogenic bladder Musculoskeletal:  spasms Integumentary: No rash, pruritus, skin lesions Neurological: as above Psychiatric: some anxiety, improved Endocrine: No palpitations, fatigue, diaphoresis, mood swings, change in appetite, change in weight, increased thirst Hematologic/Lymphatic:  No anemia, purpura, petechiae. Allergic/Immunologic: no itchy/runny eyes, nasal congestion, recent allergic reactions, rashes  PHYSICAL EXAM: Filed Vitals:   12/02/14 1329  BP: 120/68  Pulse: 104   General: No acute distress.  Patient appears well-groomed.   Head:  Normocephalic/atraumatic Eyes:  Fundoscopic exam unremarkable without vessel changes, exudates, hemorrhages or papilledema. Neck: supple, no paraspinal tenderness, full range of motion Heart:  Regular rate and rhythm Lungs:  Clear to auscultation bilaterally Back: No paraspinal tenderness Neurological Exam: alert and oriented to person, place, and time, recent and remote memory intact, fund of knowledge intact, attention and concentration intact, speech fluent and not dysarthric, language intact.  CN II-XII intact.  Increased tone in the legs.  Muscle strength 3 right hip flexion, 5- right quad, 2 left hip flexion, 4+ left quad, 2+ left ankle dorsiflexion.  Otherwise 5/5.  Mildly reduced pinprick and vibration sensation in feet.  2+ in upper extremities, 3+ in lower extremities (greater on the left), bilateral Babinski.  In wheelchair.  Cannot stand without assistance.   IMPRESSION: Secondary progressive MS Neurogenic bladder Anxiety  PLAN: Continue home exercises Follow up in 9 months.  27 minutes spent face to face with patient, over 50% spent discussing management.  Shon Millet, DO  CC:   Gwendlyn Deutscher, MD

## 2014-12-02 NOTE — Progress Notes (Signed)
Note routed

## 2014-12-14 DIAGNOSIS — K5180 Other ulcerative colitis without complications: Secondary | ICD-10-CM | POA: Diagnosis not present

## 2014-12-14 DIAGNOSIS — M2550 Pain in unspecified joint: Secondary | ICD-10-CM | POA: Diagnosis not present

## 2014-12-14 DIAGNOSIS — G35 Multiple sclerosis: Secondary | ICD-10-CM | POA: Diagnosis not present

## 2014-12-19 DIAGNOSIS — K529 Noninfective gastroenteritis and colitis, unspecified: Secondary | ICD-10-CM | POA: Diagnosis not present

## 2014-12-19 DIAGNOSIS — K5190 Ulcerative colitis, unspecified, without complications: Secondary | ICD-10-CM | POA: Diagnosis not present

## 2014-12-19 DIAGNOSIS — M1990 Unspecified osteoarthritis, unspecified site: Secondary | ICD-10-CM | POA: Diagnosis not present

## 2014-12-19 DIAGNOSIS — R600 Localized edema: Secondary | ICD-10-CM | POA: Diagnosis not present

## 2014-12-19 DIAGNOSIS — R1032 Left lower quadrant pain: Secondary | ICD-10-CM | POA: Diagnosis not present

## 2015-01-02 DIAGNOSIS — Z1211 Encounter for screening for malignant neoplasm of colon: Secondary | ICD-10-CM | POA: Diagnosis not present

## 2015-01-02 DIAGNOSIS — K6389 Other specified diseases of intestine: Secondary | ICD-10-CM | POA: Diagnosis not present

## 2015-01-02 DIAGNOSIS — K5190 Ulcerative colitis, unspecified, without complications: Secondary | ICD-10-CM | POA: Diagnosis not present

## 2015-01-02 DIAGNOSIS — K648 Other hemorrhoids: Secondary | ICD-10-CM | POA: Diagnosis not present

## 2015-01-02 LAB — HM COLONOSCOPY

## 2015-01-16 DIAGNOSIS — N312 Flaccid neuropathic bladder, not elsewhere classified: Secondary | ICD-10-CM | POA: Diagnosis not present

## 2015-01-16 DIAGNOSIS — N3941 Urge incontinence: Secondary | ICD-10-CM | POA: Diagnosis not present

## 2015-01-17 DIAGNOSIS — N839 Noninflammatory disorder of ovary, fallopian tube and broad ligament, unspecified: Secondary | ICD-10-CM | POA: Diagnosis not present

## 2015-01-17 DIAGNOSIS — R1032 Left lower quadrant pain: Secondary | ICD-10-CM | POA: Diagnosis not present

## 2015-02-01 DIAGNOSIS — N312 Flaccid neuropathic bladder, not elsewhere classified: Secondary | ICD-10-CM | POA: Diagnosis not present

## 2015-02-02 ENCOUNTER — Other Ambulatory Visit (HOSPITAL_COMMUNITY): Payer: Self-pay | Admitting: Nurse Practitioner

## 2015-02-02 DIAGNOSIS — N838 Other noninflammatory disorders of ovary, fallopian tube and broad ligament: Secondary | ICD-10-CM

## 2015-02-02 DIAGNOSIS — R1900 Intra-abdominal and pelvic swelling, mass and lump, unspecified site: Secondary | ICD-10-CM | POA: Diagnosis not present

## 2015-02-06 ENCOUNTER — Other Ambulatory Visit (HOSPITAL_COMMUNITY): Payer: Self-pay | Admitting: Nurse Practitioner

## 2015-02-06 DIAGNOSIS — R1900 Intra-abdominal and pelvic swelling, mass and lump, unspecified site: Secondary | ICD-10-CM

## 2015-02-08 DIAGNOSIS — H5213 Myopia, bilateral: Secondary | ICD-10-CM | POA: Diagnosis not present

## 2015-02-09 ENCOUNTER — Ambulatory Visit (HOSPITAL_COMMUNITY)
Admission: RE | Admit: 2015-02-09 | Discharge: 2015-02-09 | Disposition: A | Discharge status: Home / Self Care | Payer: Medicare Other | Source: Ambulatory Visit | Attending: Nurse Practitioner | Admitting: Nurse Practitioner

## 2015-02-09 DIAGNOSIS — N839 Noninflammatory disorder of ovary, fallopian tube and broad ligament, unspecified: Secondary | ICD-10-CM | POA: Insufficient documentation

## 2015-02-09 DIAGNOSIS — Z90710 Acquired absence of both cervix and uterus: Secondary | ICD-10-CM | POA: Insufficient documentation

## 2015-02-09 DIAGNOSIS — N838 Other noninflammatory disorders of ovary, fallopian tube and broad ligament: Secondary | ICD-10-CM

## 2015-02-09 DIAGNOSIS — R1900 Intra-abdominal and pelvic swelling, mass and lump, unspecified site: Secondary | ICD-10-CM

## 2015-02-14 DIAGNOSIS — K5190 Ulcerative colitis, unspecified, without complications: Secondary | ICD-10-CM | POA: Diagnosis not present

## 2015-02-22 DIAGNOSIS — Z1389 Encounter for screening for other disorder: Secondary | ICD-10-CM | POA: Diagnosis not present

## 2015-02-22 DIAGNOSIS — E785 Hyperlipidemia, unspecified: Secondary | ICD-10-CM | POA: Diagnosis not present

## 2015-02-22 DIAGNOSIS — Z79899 Other long term (current) drug therapy: Secondary | ICD-10-CM | POA: Diagnosis not present

## 2015-02-22 DIAGNOSIS — I872 Venous insufficiency (chronic) (peripheral): Secondary | ICD-10-CM | POA: Diagnosis not present

## 2015-02-22 DIAGNOSIS — D509 Iron deficiency anemia, unspecified: Secondary | ICD-10-CM | POA: Diagnosis not present

## 2015-02-22 DIAGNOSIS — D513 Other dietary vitamin B12 deficiency anemia: Secondary | ICD-10-CM | POA: Diagnosis not present

## 2015-02-22 DIAGNOSIS — M25552 Pain in left hip: Secondary | ICD-10-CM | POA: Diagnosis not present

## 2015-03-23 DIAGNOSIS — R1032 Left lower quadrant pain: Secondary | ICD-10-CM | POA: Diagnosis not present

## 2015-03-27 DIAGNOSIS — Z0000 Encounter for general adult medical examination without abnormal findings: Secondary | ICD-10-CM | POA: Diagnosis not present

## 2015-03-27 DIAGNOSIS — K51919 Ulcerative colitis, unspecified with unspecified complications: Secondary | ICD-10-CM | POA: Diagnosis not present

## 2015-03-27 DIAGNOSIS — R1032 Left lower quadrant pain: Secondary | ICD-10-CM | POA: Diagnosis not present

## 2015-03-27 DIAGNOSIS — Z1389 Encounter for screening for other disorder: Secondary | ICD-10-CM | POA: Diagnosis not present

## 2015-03-28 ENCOUNTER — Encounter: Payer: Self-pay | Admitting: Neurology

## 2015-06-05 ENCOUNTER — Telehealth: Payer: Self-pay

## 2015-06-05 NOTE — Telephone Encounter (Signed)
Received fax from after hours call service. States pt is complaining of her leg bouncing around when she stands up. Wants to know if there is any medication to help? Please advise.

## 2015-06-05 NOTE — Telephone Encounter (Signed)
Pt was scheduled for May 25th. Rescheduled from July.

## 2015-06-05 NOTE — Telephone Encounter (Signed)
I'm not really sure what that means.  It may be related to weakness, in which case there isn't any medication at this time.  If this needs further explanation, then she probably should come in so I can evaluate her.

## 2015-06-05 NOTE — Telephone Encounter (Signed)
Off/on spasticity in her left leg. Happens when pt transfers to the toilet or something. Happens occasionally, not all the time.

## 2015-06-07 DIAGNOSIS — N3030 Trigonitis without hematuria: Secondary | ICD-10-CM | POA: Diagnosis not present

## 2015-06-07 DIAGNOSIS — G35 Multiple sclerosis: Secondary | ICD-10-CM | POA: Diagnosis not present

## 2015-06-07 DIAGNOSIS — N3941 Urge incontinence: Secondary | ICD-10-CM | POA: Diagnosis not present

## 2015-06-16 ENCOUNTER — Telehealth: Payer: Self-pay | Admitting: Neurology

## 2015-06-16 NOTE — Telephone Encounter (Signed)
Pt called to ask you to write a letter to [facility] stating she does not need to have a driving evaluation. You filled out DMV paper work on the patient in September. Informed pt I was unsure of outcome of request, but would ask, due to the fact that those requests normally start from a provider, and not just the [facility] making people take the evaluation over. Pt then replied, "Oh yeah, they said he'd (I'm assuming she meant you) requested it back in September". Please advise.

## 2015-06-16 NOTE — Telephone Encounter (Signed)
Do we have a copy of the DMV paper work I filled out?

## 2015-06-16 NOTE — Telephone Encounter (Signed)
Deborah Rangel 09/08/1955. She called needing a letter from Dr. Everlena Cooper to send to the [facility]. She received a letter saying that she needed an occupational therapist to ride with her to determine she can drive safely. Her number is [phone]. Thank you

## 2015-06-16 NOTE — Telephone Encounter (Signed)
Yes, I think she doesn't need to have an evaluation. It may be better that we get another DMV form so I can make the necessary change and resubmit

## 2015-06-16 NOTE — Telephone Encounter (Signed)
Printed and placed in your inbox. Also available under Media, 09/28/14.

## 2015-06-16 NOTE — Telephone Encounter (Signed)
Mostly blank form placed in inbox

## 2015-06-16 NOTE — Telephone Encounter (Signed)
Pt aware. Would like forms mailed to her when completed. She will sign and send to [facility]

## 2015-06-23 DIAGNOSIS — K580 Irritable bowel syndrome with diarrhea: Secondary | ICD-10-CM | POA: Diagnosis not present

## 2015-06-23 DIAGNOSIS — R1032 Left lower quadrant pain: Secondary | ICD-10-CM | POA: Diagnosis not present

## 2015-06-23 DIAGNOSIS — K5190 Ulcerative colitis, unspecified, without complications: Secondary | ICD-10-CM | POA: Diagnosis not present

## 2015-06-27 ENCOUNTER — Telehealth: Payer: Self-pay | Admitting: Neurology

## 2015-06-27 DIAGNOSIS — G35 Multiple sclerosis: Secondary | ICD-10-CM

## 2015-06-27 NOTE — Telephone Encounter (Signed)
Pt needs a referral to a occupational therapist in [place] or [place] please call [phone]

## 2015-06-27 NOTE — Telephone Encounter (Signed)
Pt would like referral for OT. Pt has an upcoming appointment at the end of the month. Please advise.

## 2015-06-27 NOTE — Telephone Encounter (Signed)
For what reason?  Does she want to wait and see me first?  If not, okay for referral

## 2015-06-28 NOTE — Telephone Encounter (Signed)
Spoke with patient she is needing a driving evaluation to be done per DMV. Request was faxed to [facility]. Pt was given his information.

## 2015-06-29 ENCOUNTER — Telehealth: Payer: Self-pay | Admitting: Neurology

## 2015-06-29 NOTE — Telephone Encounter (Signed)
Pt needs a referral done to [facility] for occupational therapy patient phone number is [phone]

## 2015-06-29 NOTE — Telephone Encounter (Signed)
Referral faxed in.

## 2015-06-30 ENCOUNTER — Telehealth: Payer: Self-pay | Admitting: Neurology

## 2015-06-30 DIAGNOSIS — G35 Multiple sclerosis: Secondary | ICD-10-CM

## 2015-06-30 NOTE — Telephone Encounter (Signed)
VM-PT left message that she still has not heard on the referral/Dawn CB# [phone]

## 2015-06-30 NOTE — Telephone Encounter (Signed)
Deborah Rangel 03/28/1955.  Renata Caprice called from [employer] [phone]. He said Crystel had called him and said the [facility] told her she needed an Acupuncturist to assist with this. He said you can call him if you have questions.

## 2015-06-30 NOTE — Telephone Encounter (Signed)
Results were left on pt's voicemail, with instructions to call back with any questions or concerns in relation to results.   

## 2015-07-11 DIAGNOSIS — K5900 Constipation, unspecified: Secondary | ICD-10-CM | POA: Diagnosis not present

## 2015-07-13 ENCOUNTER — Ambulatory Visit: Payer: Medicare Other | Admitting: Neurology

## 2015-07-19 DIAGNOSIS — R1030 Lower abdominal pain, unspecified: Secondary | ICD-10-CM | POA: Diagnosis not present

## 2015-07-19 DIAGNOSIS — R1032 Left lower quadrant pain: Secondary | ICD-10-CM | POA: Diagnosis not present

## 2015-07-31 ENCOUNTER — Encounter: Payer: Self-pay | Admitting: Neurology

## 2015-07-31 ENCOUNTER — Ambulatory Visit (INDEPENDENT_AMBULATORY_CARE_PROVIDER_SITE_OTHER): Payer: Medicare Other | Admitting: Neurology

## 2015-07-31 VITALS — BP 110/70 | HR 68 | Ht 64.0 in

## 2015-07-31 DIAGNOSIS — G35 Multiple sclerosis: Secondary | ICD-10-CM

## 2015-07-31 DIAGNOSIS — M545 Low back pain, unspecified: Secondary | ICD-10-CM

## 2015-07-31 DIAGNOSIS — R1032 Left lower quadrant pain: Secondary | ICD-10-CM

## 2015-07-31 DIAGNOSIS — R1030 Lower abdominal pain, unspecified: Secondary | ICD-10-CM | POA: Diagnosis not present

## 2015-07-31 NOTE — Progress Notes (Signed)
Chart forwarded.  

## 2015-07-31 NOTE — Patient Instructions (Addendum)
1.  Will get MRI of lumbar spine to look for evidence of radiculopathy [facility] ([address] Sherian Maroon [place], [place] [address] ph: [phone] fax: [phone]) Appointment scheduled for 08/09/15 @ 12:15 2.  Follow up in 9 months

## 2015-07-31 NOTE — Progress Notes (Signed)
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    UPDATE: Overall, everything is stable.  She reports left groin pain.  She had a previous mass of her left ovary, but recent ultrasound did not show anything.  She does have left lower back pain.  HISTORY: Around 2006, she developed numbness in her mid to lower back.  She was evaluated by a neurologist at that time.  MRI of the brain and cervical spine were performed and reportedly revealed abnormal signal and enhancement.  A lumbar puncture was reportedly positive for oligoclonal bands.  Serum ANA was negative.  She was treated with IV Solu-Medrol, which treated the symptoms but caused steroid psychosis  She was started on Copaxone.  Over the next several years, she has had a gradual progression of symptoms.  She reports that sometimes there would be fluctuation in her foot weakness, but overall  without acute episodes of flare-up.  These symptoms include worsening gait.  For the past two years she is mostly in a wheelchair and rarely uses the walker.  She has both an overactive bladder and bladder retention.  She reports some mild fatigue.  She denies optic neuritis, muscle spasms, or neuropathic pain.  She discontinued Copaxone in 2012.    MRI of the brain with and without contrast performed in January 2016 showed confluent non-enhancing T2 hyperintense lesions, including the cerebellar peduncle, right thalamus and splenium of corpus callosum.  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  She currently takes sertraline 100mg  and lorazepam 0.5mg  for depression. She takes Ambien to help her sleep.  Ritalin and Nuvigil for fatigue were ineffective.  Ampyra for gait was ineffective.  PAST  MEDICAL HISTORY: Past Medical History  Diagnosis Date  . Multiple sclerosis (HCC)   . Hypoglycemia   . Obesity   . Mild chronic ulcerative colitis (HCC)   . Panic disorder without agoraphobia   . Pure hypercholesterolemia     MEDICATIONS: Current Outpatient Prescriptions on File Prior to Visit  Medication Sig Dispense Refill  . LIALDA 1.2 G EC tablet Take 2.4 g by mouth 2 (two) times daily.  2  . LORazepam (ATIVAN) 1 MG tablet TAKE 1/2 TO 1 TABLET BY MOUTH THREE TIMES DAILY AS NEEDED FOR ANXIETY  1  . pravastatin (PRAVACHOL) 40 MG tablet Take 80 mg by mouth at bedtime.   0  . sertraline (ZOLOFT) 100 MG tablet Take 200 mg by mouth daily.  0  . zolpidem (AMBIEN) 10 MG tablet Take 10 mg by mouth at bedtime as needed. for sleep  1  . gabapentin (NEURONTIN) 100 MG capsule Take 100 mg by mouth at bedtime. Reported on 07/31/2015  0   No current facility-administered medications on file prior to visit.    ALLERGIES: Allergies  Allergen Reactions  . Codeine Shortness Of Breath    FAMILY HISTORY: History reviewed. No pertinent family history.  SOCIAL HISTORY: Social History   Social History  . Marital Status: Married    Spouse Name: N/A  . Number of Children: N/A  . Years of Education: N/A   Occupational History  . Not on file.   Social History Main Topics  . Smoking status: Never Smoker   . Smokeless tobacco: Not on file  . Alcohol  Use: No  . Drug Use: No  . Sexual Activity:    Partners: Male   Other Topics Concern  . Not on file   Social History Narrative    REVIEW OF SYSTEMS: Constitutional: No fevers, chills, or sweats, no generalized fatigue, change in appetite Eyes: No visual changes, double vision, eye pain Ear, nose and throat: No hearing loss, ear pain, nasal congestion, sore throat Cardiovascular: No chest pain, palpitations Respiratory:  No shortness of breath at rest or with exertion, wheezes GastrointestinaI: No nausea, vomiting, diarrhea, abdominal  pain, fecal incontinence Genitourinary:  No dysuria, urinary retention or frequency Musculoskeletal:  No neck pain, back pain Integumentary: No rash, pruritus, skin lesions Neurological: as above Psychiatric: No depression, insomnia, anxiety Endocrine: No palpitations, fatigue, diaphoresis, mood swings, change in appetite, change in weight, increased thirst Hematologic/Lymphatic:  No purpura, petechiae. Allergic/Immunologic: no itchy/runny eyes, nasal congestion, recent allergic reactions, rashes  PHYSICAL EXAM: Filed Vitals:   07/31/15 1416  BP: 110/70  Pulse: 68   General: No acute distress.  Patient appears well-groomed.   Head:  Normocephalic/atraumatic Eyes:  Fundi examined but not visualized Neck: supple, no paraspinal tenderness, full range of motion Heart:  Regular rate and rhythm Lungs:  Clear to auscultation bilaterally Back: No paraspinal tenderness Neurological Exam: alert and oriented to person, place, and time, recent and remote memory intact, fund of knowledge intact, attention and concentration intact, speech fluent and not dysarthric, language intact.  CN II-XII intact.  Increased tone in the legs.  Muscle strength 3-/5 right hip flexion, 2+/5 left hip flexion, 5-/5 right hamstring, 4+/5 left hamstring, 2-3/5 quads, 2/5 left ankle dorsiflexion.  Otherwise 5/5.  Reduced pinprick sensation on dorsum of left foot.  Vibration sensation intact.  2+ in upper extremities, 3+ in lower extremities (greater on the left), bilateral Babinski.  Cannot stand without assistance.    IMPRESSION: Secondary progressive multiple sclerosis Left lower back pain Left groin pain  Question if the groin pain may be due to an upper lumbar radiculopathy  PLAN: 1.  We will check MRI of lumbar spine.  At this point, she does not want to try gabapentin.  Further recommendations pending results 2.  Follow up in 9 months.  21 minutes spent face to face with patient, over 50% spent discussing  management and diagnosis.  Shon Millet, DO  CC:  Gwendlyn Deutscher, MD

## 2015-08-09 ENCOUNTER — Other Ambulatory Visit: Payer: Medicare Other

## 2015-08-09 ENCOUNTER — Inpatient Hospital Stay: Admission: RE | Admit: 2015-08-09 | Payer: Medicare Other | Source: Ambulatory Visit

## 2015-08-17 ENCOUNTER — Inpatient Hospital Stay: Admission: RE | Admit: 2015-08-17 | Payer: Medicare Other | Source: Ambulatory Visit

## 2015-08-29 ENCOUNTER — Telehealth: Payer: Self-pay

## 2015-08-29 ENCOUNTER — Ambulatory Visit
Admission: RE | Admit: 2015-08-29 | Discharge: 2015-08-29 | Disposition: A | Discharge status: Home / Self Care | Payer: Medicare Other | Source: Ambulatory Visit | Attending: Neurology | Admitting: Neurology

## 2015-08-29 DIAGNOSIS — M545 Low back pain, unspecified: Secondary | ICD-10-CM

## 2015-08-29 DIAGNOSIS — M5126 Other intervertebral disc displacement, lumbar region: Secondary | ICD-10-CM | POA: Diagnosis not present

## 2015-08-29 DIAGNOSIS — R1032 Left lower quadrant pain: Secondary | ICD-10-CM

## 2015-08-29 NOTE — Telephone Encounter (Signed)
Message relayed to patient. Verbalized understanding and denied questions.   

## 2015-08-29 NOTE — Telephone Encounter (Signed)
-----   Message from Drema Dallas, DO sent at 08/29/2015 12:08 PM EDT ----- Reviewed MRI.  There is mild disc bulge but it does not explain the groin pain.  It does not appear to be due to a pinched nerve from the back

## 2015-09-01 ENCOUNTER — Ambulatory Visit: Payer: Medicare Other | Admitting: Neurology

## 2015-09-20 DIAGNOSIS — M25552 Pain in left hip: Secondary | ICD-10-CM | POA: Diagnosis not present

## 2015-09-20 DIAGNOSIS — R1032 Left lower quadrant pain: Secondary | ICD-10-CM | POA: Diagnosis not present

## 2015-10-09 ENCOUNTER — Telehealth: Payer: Self-pay | Admitting: Neurology

## 2015-10-09 NOTE — Telephone Encounter (Signed)
Called and left vm for pt to return call to let us know if she'd like to pick letter up? [place] to mail it? Or to provide [place] with the fax number to the [facility]

## 2015-10-09 NOTE — Telephone Encounter (Signed)
PT called and said she is at the [facility] and needs a note stating she has MS so she can take the class/Dawn CB#[phone]

## 2015-10-10 NOTE — Telephone Encounter (Signed)
Spoke with patient. Just needs letter to Aquatic Director, Nelly Rout (fax: [phone]) with MS Dx stated so she can participate in their activities. Letter composed and faxed (electornically) to Mrs. Manson Passey.

## 2015-10-18 DIAGNOSIS — Z1389 Encounter for screening for other disorder: Secondary | ICD-10-CM | POA: Diagnosis not present

## 2015-10-18 DIAGNOSIS — R109 Unspecified abdominal pain: Secondary | ICD-10-CM | POA: Diagnosis not present

## 2015-10-18 DIAGNOSIS — K5190 Ulcerative colitis, unspecified, without complications: Secondary | ICD-10-CM | POA: Diagnosis not present

## 2015-10-20 DIAGNOSIS — K5190 Ulcerative colitis, unspecified, without complications: Secondary | ICD-10-CM | POA: Diagnosis not present

## 2015-10-24 DIAGNOSIS — R1032 Left lower quadrant pain: Secondary | ICD-10-CM | POA: Diagnosis not present

## 2015-11-06 ENCOUNTER — Telehealth: Payer: Self-pay | Admitting: Internal Medicine

## 2015-11-06 DIAGNOSIS — Z1231 Encounter for screening mammogram for malignant neoplasm of breast: Secondary | ICD-10-CM | POA: Diagnosis not present

## 2015-11-06 LAB — HM MAMMOGRAPHY

## 2015-11-06 NOTE — Telephone Encounter (Signed)
received

## 2015-11-16 NOTE — Telephone Encounter (Signed)
Patient states that she is going elsewhere for care. Records have been taken off of Dr. Lauro Franklin desk and shredded.

## 2015-11-22 ENCOUNTER — Telehealth: Payer: Self-pay | Admitting: Neurology

## 2015-11-22 DIAGNOSIS — K5190 Ulcerative colitis, unspecified, without complications: Secondary | ICD-10-CM | POA: Diagnosis not present

## 2015-11-22 DIAGNOSIS — R1032 Left lower quadrant pain: Secondary | ICD-10-CM | POA: Diagnosis not present

## 2015-11-22 DIAGNOSIS — G35 Multiple sclerosis: Secondary | ICD-10-CM | POA: Diagnosis not present

## 2015-11-22 NOTE — Telephone Encounter (Signed)
Patient wants to know if she can have a ilioinguinal nerve block please call [phone]

## 2015-11-22 NOTE — Telephone Encounter (Signed)
Spoke with patient. Still having groin pain. Would like to get an ilioinguinal nerve block. Please advise.

## 2015-11-23 NOTE — Telephone Encounter (Signed)
We would have to refer her to a pain specialist who performs this.

## 2015-11-23 NOTE — Telephone Encounter (Signed)
Patient was returning your call. Thank you °

## 2015-11-23 NOTE — Telephone Encounter (Signed)
Message relayed to patient. Verbalized understanding and denied questions. Will speak to her pain specialist.

## 2015-11-23 NOTE — Telephone Encounter (Signed)
Left message on machine for pt to return call to the office.  

## 2015-11-29 DIAGNOSIS — K5190 Ulcerative colitis, unspecified, without complications: Secondary | ICD-10-CM | POA: Diagnosis not present

## 2015-11-29 DIAGNOSIS — R1032 Left lower quadrant pain: Secondary | ICD-10-CM | POA: Diagnosis not present

## 2015-11-29 DIAGNOSIS — G35 Multiple sclerosis: Secondary | ICD-10-CM | POA: Diagnosis not present

## 2015-12-19 DIAGNOSIS — R1032 Left lower quadrant pain: Secondary | ICD-10-CM | POA: Diagnosis not present

## 2015-12-19 DIAGNOSIS — G35 Multiple sclerosis: Secondary | ICD-10-CM | POA: Diagnosis not present

## 2015-12-19 DIAGNOSIS — K5190 Ulcerative colitis, unspecified, without complications: Secondary | ICD-10-CM | POA: Diagnosis not present

## 2016-01-22 DIAGNOSIS — K5190 Ulcerative colitis, unspecified, without complications: Secondary | ICD-10-CM | POA: Diagnosis not present

## 2016-01-22 DIAGNOSIS — R269 Unspecified abnormalities of gait and mobility: Secondary | ICD-10-CM | POA: Diagnosis not present

## 2016-01-22 DIAGNOSIS — M25561 Pain in right knee: Secondary | ICD-10-CM | POA: Diagnosis not present

## 2016-01-23 DIAGNOSIS — G35 Multiple sclerosis: Secondary | ICD-10-CM | POA: Diagnosis not present

## 2016-01-23 DIAGNOSIS — K5190 Ulcerative colitis, unspecified, without complications: Secondary | ICD-10-CM | POA: Diagnosis not present

## 2016-01-23 DIAGNOSIS — R1032 Left lower quadrant pain: Secondary | ICD-10-CM | POA: Diagnosis not present

## 2016-01-24 DIAGNOSIS — M130 Polyarthritis, unspecified: Secondary | ICD-10-CM | POA: Diagnosis not present

## 2016-01-24 DIAGNOSIS — I872 Venous insufficiency (chronic) (peripheral): Secondary | ICD-10-CM | POA: Diagnosis not present

## 2016-01-24 DIAGNOSIS — M25552 Pain in left hip: Secondary | ICD-10-CM | POA: Diagnosis not present

## 2016-01-24 DIAGNOSIS — Z9181 History of falling: Secondary | ICD-10-CM | POA: Diagnosis not present

## 2016-01-24 DIAGNOSIS — G35 Multiple sclerosis: Secondary | ICD-10-CM | POA: Diagnosis not present

## 2016-01-25 DIAGNOSIS — S82831A Other fracture of upper and lower end of right fibula, initial encounter for closed fracture: Secondary | ICD-10-CM | POA: Diagnosis not present

## 2016-01-29 ENCOUNTER — Encounter: Payer: Self-pay | Admitting: Neurology

## 2016-01-29 DIAGNOSIS — I872 Venous insufficiency (chronic) (peripheral): Secondary | ICD-10-CM | POA: Diagnosis not present

## 2016-01-29 DIAGNOSIS — M130 Polyarthritis, unspecified: Secondary | ICD-10-CM | POA: Diagnosis not present

## 2016-01-29 DIAGNOSIS — Z9181 History of falling: Secondary | ICD-10-CM | POA: Diagnosis not present

## 2016-01-29 DIAGNOSIS — M25552 Pain in left hip: Secondary | ICD-10-CM | POA: Diagnosis not present

## 2016-01-29 DIAGNOSIS — G35 Multiple sclerosis: Secondary | ICD-10-CM | POA: Diagnosis not present

## 2016-01-31 DIAGNOSIS — M130 Polyarthritis, unspecified: Secondary | ICD-10-CM | POA: Diagnosis not present

## 2016-01-31 DIAGNOSIS — M25552 Pain in left hip: Secondary | ICD-10-CM | POA: Diagnosis not present

## 2016-01-31 DIAGNOSIS — G35 Multiple sclerosis: Secondary | ICD-10-CM | POA: Diagnosis not present

## 2016-01-31 DIAGNOSIS — I872 Venous insufficiency (chronic) (peripheral): Secondary | ICD-10-CM | POA: Diagnosis not present

## 2016-01-31 DIAGNOSIS — Z9181 History of falling: Secondary | ICD-10-CM | POA: Diagnosis not present

## 2016-02-05 DIAGNOSIS — I872 Venous insufficiency (chronic) (peripheral): Secondary | ICD-10-CM | POA: Diagnosis not present

## 2016-02-05 DIAGNOSIS — M25552 Pain in left hip: Secondary | ICD-10-CM | POA: Diagnosis not present

## 2016-02-05 DIAGNOSIS — Z9181 History of falling: Secondary | ICD-10-CM | POA: Diagnosis not present

## 2016-02-05 DIAGNOSIS — G35 Multiple sclerosis: Secondary | ICD-10-CM | POA: Diagnosis not present

## 2016-02-05 DIAGNOSIS — M130 Polyarthritis, unspecified: Secondary | ICD-10-CM | POA: Diagnosis not present

## 2016-02-07 DIAGNOSIS — I872 Venous insufficiency (chronic) (peripheral): Secondary | ICD-10-CM | POA: Diagnosis not present

## 2016-02-07 DIAGNOSIS — G35 Multiple sclerosis: Secondary | ICD-10-CM | POA: Diagnosis not present

## 2016-02-07 DIAGNOSIS — M130 Polyarthritis, unspecified: Secondary | ICD-10-CM | POA: Diagnosis not present

## 2016-02-07 DIAGNOSIS — Z9181 History of falling: Secondary | ICD-10-CM | POA: Diagnosis not present

## 2016-02-07 DIAGNOSIS — M25552 Pain in left hip: Secondary | ICD-10-CM | POA: Diagnosis not present

## 2016-02-09 DIAGNOSIS — Z01818 Encounter for other preprocedural examination: Secondary | ICD-10-CM | POA: Diagnosis not present

## 2016-02-14 DIAGNOSIS — M25552 Pain in left hip: Secondary | ICD-10-CM | POA: Diagnosis not present

## 2016-02-14 DIAGNOSIS — Z9181 History of falling: Secondary | ICD-10-CM | POA: Diagnosis not present

## 2016-02-14 DIAGNOSIS — I872 Venous insufficiency (chronic) (peripheral): Secondary | ICD-10-CM | POA: Diagnosis not present

## 2016-02-14 DIAGNOSIS — M130 Polyarthritis, unspecified: Secondary | ICD-10-CM | POA: Diagnosis not present

## 2016-02-14 DIAGNOSIS — G35 Multiple sclerosis: Secondary | ICD-10-CM | POA: Diagnosis not present

## 2016-02-15 DIAGNOSIS — K5190 Ulcerative colitis, unspecified, without complications: Secondary | ICD-10-CM | POA: Diagnosis not present

## 2016-02-15 DIAGNOSIS — R1032 Left lower quadrant pain: Secondary | ICD-10-CM | POA: Diagnosis not present

## 2016-02-15 DIAGNOSIS — G35 Multiple sclerosis: Secondary | ICD-10-CM | POA: Diagnosis not present

## 2016-02-16 DIAGNOSIS — G8929 Other chronic pain: Secondary | ICD-10-CM | POA: Diagnosis not present

## 2016-02-16 DIAGNOSIS — Z79899 Other long term (current) drug therapy: Secondary | ICD-10-CM | POA: Diagnosis not present

## 2016-02-16 DIAGNOSIS — K5190 Ulcerative colitis, unspecified, without complications: Secondary | ICD-10-CM | POA: Diagnosis not present

## 2016-02-16 DIAGNOSIS — R1032 Left lower quadrant pain: Secondary | ICD-10-CM | POA: Diagnosis not present

## 2016-02-16 DIAGNOSIS — R102 Pelvic and perineal pain: Secondary | ICD-10-CM | POA: Diagnosis not present

## 2016-02-16 DIAGNOSIS — G35 Multiple sclerosis: Secondary | ICD-10-CM | POA: Diagnosis not present

## 2016-02-23 DIAGNOSIS — S82831A Other fracture of upper and lower end of right fibula, initial encounter for closed fracture: Secondary | ICD-10-CM | POA: Diagnosis not present

## 2016-03-05 DIAGNOSIS — G35 Multiple sclerosis: Secondary | ICD-10-CM | POA: Diagnosis not present

## 2016-03-05 DIAGNOSIS — M25552 Pain in left hip: Secondary | ICD-10-CM | POA: Diagnosis not present

## 2016-03-05 DIAGNOSIS — M130 Polyarthritis, unspecified: Secondary | ICD-10-CM | POA: Diagnosis not present

## 2016-03-21 ENCOUNTER — Telehealth: Payer: Self-pay | Admitting: Gastroenterology

## 2016-03-21 DIAGNOSIS — Z9181 History of falling: Secondary | ICD-10-CM | POA: Diagnosis not present

## 2016-03-21 DIAGNOSIS — G35 Multiple sclerosis: Secondary | ICD-10-CM | POA: Diagnosis not present

## 2016-03-21 DIAGNOSIS — M25552 Pain in left hip: Secondary | ICD-10-CM | POA: Diagnosis not present

## 2016-03-21 DIAGNOSIS — M130 Polyarthritis, unspecified: Secondary | ICD-10-CM | POA: Diagnosis not present

## 2016-03-21 DIAGNOSIS — I872 Venous insufficiency (chronic) (peripheral): Secondary | ICD-10-CM | POA: Diagnosis not present

## 2016-03-21 NOTE — Telephone Encounter (Signed)
Patient called back wanting to schedule appointment. Patient has been seen elsewhere for care and we do need records to be reviewed before scheduling.

## 2016-03-21 NOTE — Telephone Encounter (Signed)
Received [facility] records. Patient states that she is wanting to transfer because she states that the office was "horrible about informing her of appointments, etc" Dr. Adela Lank is Doc of the Day. Records placed on his desk for review.

## 2016-03-22 ENCOUNTER — Encounter: Payer: Self-pay | Admitting: Gastroenterology

## 2016-03-22 DIAGNOSIS — S82831A Other fracture of upper and lower end of right fibula, initial encounter for closed fracture: Secondary | ICD-10-CM | POA: Diagnosis not present

## 2016-03-22 NOTE — Telephone Encounter (Signed)
Dr.Armbruster reviewed records and accepted patient. Left message for patient to call back and schedule appointment.

## 2016-03-26 DIAGNOSIS — Z9181 History of falling: Secondary | ICD-10-CM | POA: Diagnosis not present

## 2016-03-26 DIAGNOSIS — M25552 Pain in left hip: Secondary | ICD-10-CM | POA: Diagnosis not present

## 2016-03-26 DIAGNOSIS — G35 Multiple sclerosis: Secondary | ICD-10-CM | POA: Diagnosis not present

## 2016-03-26 DIAGNOSIS — I872 Venous insufficiency (chronic) (peripheral): Secondary | ICD-10-CM | POA: Diagnosis not present

## 2016-03-26 DIAGNOSIS — M130 Polyarthritis, unspecified: Secondary | ICD-10-CM | POA: Diagnosis not present

## 2016-03-28 DIAGNOSIS — M25552 Pain in left hip: Secondary | ICD-10-CM | POA: Diagnosis not present

## 2016-03-28 DIAGNOSIS — G35 Multiple sclerosis: Secondary | ICD-10-CM | POA: Diagnosis not present

## 2016-03-28 DIAGNOSIS — Z9181 History of falling: Secondary | ICD-10-CM | POA: Diagnosis not present

## 2016-03-28 DIAGNOSIS — M130 Polyarthritis, unspecified: Secondary | ICD-10-CM | POA: Diagnosis not present

## 2016-03-28 DIAGNOSIS — I872 Venous insufficiency (chronic) (peripheral): Secondary | ICD-10-CM | POA: Diagnosis not present

## 2016-03-29 DIAGNOSIS — G35 Multiple sclerosis: Secondary | ICD-10-CM | POA: Diagnosis not present

## 2016-04-02 ENCOUNTER — Ambulatory Visit: Payer: Medicare Other | Admitting: Gastroenterology

## 2016-04-02 DIAGNOSIS — I872 Venous insufficiency (chronic) (peripheral): Secondary | ICD-10-CM | POA: Diagnosis not present

## 2016-04-02 DIAGNOSIS — G35 Multiple sclerosis: Secondary | ICD-10-CM | POA: Diagnosis not present

## 2016-04-02 DIAGNOSIS — Z9181 History of falling: Secondary | ICD-10-CM | POA: Diagnosis not present

## 2016-04-02 DIAGNOSIS — M25552 Pain in left hip: Secondary | ICD-10-CM | POA: Diagnosis not present

## 2016-04-02 DIAGNOSIS — M130 Polyarthritis, unspecified: Secondary | ICD-10-CM | POA: Diagnosis not present

## 2016-04-05 DIAGNOSIS — M25552 Pain in left hip: Secondary | ICD-10-CM | POA: Diagnosis not present

## 2016-04-05 DIAGNOSIS — G35 Multiple sclerosis: Secondary | ICD-10-CM | POA: Diagnosis not present

## 2016-04-05 DIAGNOSIS — M130 Polyarthritis, unspecified: Secondary | ICD-10-CM | POA: Diagnosis not present

## 2016-04-05 DIAGNOSIS — Z9181 History of falling: Secondary | ICD-10-CM | POA: Diagnosis not present

## 2016-04-05 DIAGNOSIS — I872 Venous insufficiency (chronic) (peripheral): Secondary | ICD-10-CM | POA: Diagnosis not present

## 2016-04-09 DIAGNOSIS — I872 Venous insufficiency (chronic) (peripheral): Secondary | ICD-10-CM | POA: Diagnosis not present

## 2016-04-09 DIAGNOSIS — M130 Polyarthritis, unspecified: Secondary | ICD-10-CM | POA: Diagnosis not present

## 2016-04-09 DIAGNOSIS — Z9181 History of falling: Secondary | ICD-10-CM | POA: Diagnosis not present

## 2016-04-09 DIAGNOSIS — M25552 Pain in left hip: Secondary | ICD-10-CM | POA: Diagnosis not present

## 2016-04-09 DIAGNOSIS — G35 Multiple sclerosis: Secondary | ICD-10-CM | POA: Diagnosis not present

## 2016-04-16 DIAGNOSIS — Z9181 History of falling: Secondary | ICD-10-CM | POA: Diagnosis not present

## 2016-04-16 DIAGNOSIS — M25552 Pain in left hip: Secondary | ICD-10-CM | POA: Diagnosis not present

## 2016-04-16 DIAGNOSIS — G35 Multiple sclerosis: Secondary | ICD-10-CM | POA: Diagnosis not present

## 2016-04-16 DIAGNOSIS — I872 Venous insufficiency (chronic) (peripheral): Secondary | ICD-10-CM | POA: Diagnosis not present

## 2016-04-16 DIAGNOSIS — M130 Polyarthritis, unspecified: Secondary | ICD-10-CM | POA: Diagnosis not present

## 2016-04-18 DIAGNOSIS — M25552 Pain in left hip: Secondary | ICD-10-CM | POA: Diagnosis not present

## 2016-04-18 DIAGNOSIS — Z9181 History of falling: Secondary | ICD-10-CM | POA: Diagnosis not present

## 2016-04-18 DIAGNOSIS — G35 Multiple sclerosis: Secondary | ICD-10-CM | POA: Diagnosis not present

## 2016-04-18 DIAGNOSIS — M130 Polyarthritis, unspecified: Secondary | ICD-10-CM | POA: Diagnosis not present

## 2016-04-18 DIAGNOSIS — I872 Venous insufficiency (chronic) (peripheral): Secondary | ICD-10-CM | POA: Diagnosis not present

## 2016-04-19 DIAGNOSIS — M25531 Pain in right wrist: Secondary | ICD-10-CM | POA: Diagnosis not present

## 2016-04-19 DIAGNOSIS — S82831A Other fracture of upper and lower end of right fibula, initial encounter for closed fracture: Secondary | ICD-10-CM | POA: Diagnosis not present

## 2016-04-23 DIAGNOSIS — M130 Polyarthritis, unspecified: Secondary | ICD-10-CM | POA: Diagnosis not present

## 2016-04-23 DIAGNOSIS — M25552 Pain in left hip: Secondary | ICD-10-CM | POA: Diagnosis not present

## 2016-04-23 DIAGNOSIS — I872 Venous insufficiency (chronic) (peripheral): Secondary | ICD-10-CM | POA: Diagnosis not present

## 2016-04-23 DIAGNOSIS — G35 Multiple sclerosis: Secondary | ICD-10-CM | POA: Diagnosis not present

## 2016-04-23 DIAGNOSIS — Z9181 History of falling: Secondary | ICD-10-CM | POA: Diagnosis not present

## 2016-04-26 DIAGNOSIS — Z9181 History of falling: Secondary | ICD-10-CM | POA: Diagnosis not present

## 2016-04-26 DIAGNOSIS — I872 Venous insufficiency (chronic) (peripheral): Secondary | ICD-10-CM | POA: Diagnosis not present

## 2016-04-26 DIAGNOSIS — M25552 Pain in left hip: Secondary | ICD-10-CM | POA: Diagnosis not present

## 2016-04-26 DIAGNOSIS — G35 Multiple sclerosis: Secondary | ICD-10-CM | POA: Diagnosis not present

## 2016-04-26 DIAGNOSIS — M130 Polyarthritis, unspecified: Secondary | ICD-10-CM | POA: Diagnosis not present

## 2016-04-29 ENCOUNTER — Ambulatory Visit: Payer: Medicare Other | Admitting: Neurology

## 2016-05-01 ENCOUNTER — Encounter: Payer: Self-pay | Admitting: Gastroenterology

## 2016-05-01 ENCOUNTER — Ambulatory Visit (INDEPENDENT_AMBULATORY_CARE_PROVIDER_SITE_OTHER): Payer: Medicare Other | Admitting: Gastroenterology

## 2016-05-01 ENCOUNTER — Encounter (INDEPENDENT_AMBULATORY_CARE_PROVIDER_SITE_OTHER): Payer: Self-pay

## 2016-05-01 VITALS — BP 108/60 | HR 80

## 2016-05-01 DIAGNOSIS — R1032 Left lower quadrant pain: Secondary | ICD-10-CM | POA: Diagnosis not present

## 2016-05-01 DIAGNOSIS — K5150 Left sided colitis without complications: Secondary | ICD-10-CM | POA: Diagnosis not present

## 2016-05-01 MED ORDER — GABAPENTIN 300 MG PO CAPS: 300.0000 mg | ORAL_CAPSULE | Freq: Every day | ORAL | 0 refills | Status: DC

## 2016-05-01 NOTE — Progress Notes (Signed)
HPI :  61 y/o female with a history of reported UC diagnosed in 2013, multiple sclerosis (wheelchair bound), obesity, here for further evaluation and a second opinion for chronic left lower quadrant pain.  Patient reports pain in the LLQ for "years", unclear how long, at least a few years. Pain in the LLQ to left inguinal area. Pain is present most of the time, but severity can fluctuate. Pain present 24/7. Mildest pain rated 3-4/10, and when it at it's worst, 8-9/10. Severe pain can be once or twice per day, maybe a minute. Sharp stabbing pain, no burning pain. Having a bowel movement makes it worse, but she thinks anything straining the abdominal wall can make it worse. It is very tender to the touch. Eating does not make it worse. She has some nausea, but no vomiting. She has some occasional rectal bleeding. She has had some loose stools intermittently. She has a history of UC, diagnosed around 2013, reportedly left sided up to 30cm based on colonoscopy below. She has been off all therapy since her last colonoscopy when her exam was normal. She thinks she was having pain at the time of her last colonoscopy. Weight is stable.   She had an ex-lap about 2 months ago per general surgery, no abnormality was noted per patient to cause her pain. No hernias. She thinks she has had a prior trigger point injection which it did not help. She also states she had the nerve "cut" without relief.   No FH of CRC.  Prior workup has included: Negative CT abdomen / pelvis with contrast 07/14/15 CT abdomen / pelvis with contrast 12/19/14 - mild wall thickening of left colon, , left adnexal 1cm lesion (seen by GYN for this) Colonoscopy 01/02/2015 - mild erythema up to 30cm, otherwise normal exam to TI - path shows mild inflammation with "mild architectural distortion EGD 2013 - report not available Xray pelvis 09/2015 normal Ex-lap as above, negative  Labs 10/20/15 - ALT 25, AST 19, AP 81, T bil 0.5 - renal  function normal - WBC 8.0, Hgb 12.0, MCV 83, Plt 272   Past Medical History:  Diagnosis Date  . Hypoglycemia   . Mild chronic ulcerative colitis (HCC)   . Multiple sclerosis (HCC)   . Obesity   . Panic disorder without agoraphobia   . Pure hypercholesterolemia      Past Surgical History:  Procedure Laterality Date  . arthroscopic knee surgery- left    . HYSTEROTOMY    . lower back      History reviewed. No pertinent family history. Social History  Substance Use Topics  . Smoking status: Never Smoker  . Smokeless tobacco: Not on file  . Alcohol use No   Current Outpatient Prescriptions  Medication Sig Dispense Refill  . LORazepam (ATIVAN) 1 MG tablet TAKE 1/2 TO 1 TABLET BY MOUTH THREE TIMES DAILY AS NEEDED FOR ANXIETY  1  . pravastatin (PRAVACHOL) 40 MG tablet Take 80 mg by mouth at bedtime.   0  . sertraline (ZOLOFT) 100 MG tablet Take 200 mg by mouth daily.  0  . zolpidem (AMBIEN) 10 MG tablet Take 10 mg by mouth at bedtime as needed. for sleep  1  . gabapentin (NEURONTIN) 300 MG capsule Take 1 capsule (300 mg total) by mouth at bedtime. 168 capsule 0   No current facility-administered medications for this visit.    Allergies  Allergen Reactions  . Codeine Shortness Of Breath     Review of Systems:  All systems reviewed and negative except where noted in HPI.    No results found.  Physical Exam: BP 108/60   Pulse 80  Constitutional: Pleasant,well-developed, female in no acute distress, sitting in wheelchair HEENT: Normocephalic and atraumatic. Conjunctivae are normal. No scleral icterus. Neck supple.  Cardiovascular: Normal rate, regular rhythm.  Pulmonary/chest: Effort normal and breath sounds normal.  Abdominal: Soft, nondistended, protuberant, with focal pain with palpation along lower LLQ (on pannus). There are no masses palpable.  Extremities: no edema Lymphadenopathy: No cervical adenopathy noted. Neurological: Alert and oriented to person place and  time. Skin: Skin is warm and dry. No rashes noted. Psychiatric: Normal mood and affect. Behavior is normal.   ASSESSMENT AND PLAN: 61 year old female with medical problems as outlined above here for a second opinion for the following issues:  LLQ pain - extensive evaluation with endoscopies, multiple CT scans, exploratory laparotomy without any clear cause. She is focally tender, symptoms easily reproducible, pain present 24 7. I suspect she has abdominal wall pain /neuropathic pain causing this presentation. I discussed his entity with her and her husband at length, and reassured them on findings to date. Recommend trying her pain modulating regimen to include gabapentin 300 mg daily at bedtime for one week and then increased to 600 mg daily at bedtime. Also recommend a trial of topical capsaicin cream to see if this helps. She thinks she has had a prior trigger point injection although the details of this are unclear, we'll try medical management first, may consider adding TCA as well. She can contact me in a month or 2 to see if she is doing.  Left sided colitis - not on any medication currently, has rare rectal bleeding, rarely loose stools. It's possible she has mild active colitis causing some of her bowel symptoms but I don't think this is causing her abdominal pain. That being said if she has active colitis she would warrant therapy. I discussed options with her. We will proceed with flexible sigmoidoscopy to assess for active colitis. She will have this done without sedation. Given her inability to walk I suspect this will need to be at the hospital local reviewed to see if this can be done at the [facility] and contact her for scheduling.   Ileene Patrick, MD [facility] Pager [phone]  CC: Noni Saupe, MD

## 2016-05-01 NOTE — Patient Instructions (Signed)
If you are age 60 or older, your body mass index should be between 23-30. Your There is no height or weight on file to calculate BMI. If this is out of the aforementioned range listed, please consider follow up with your Primary Care Provider.  If you are age 80 or younger, your body mass index should be between 19-25. Your There is no height or weight on file to calculate BMI. If this is out of the aformentioned range listed, please consider follow up with your Primary Care Provider.   We have sent the following medications to your pharmacy for you to pick up at your convenience:  Gabapentin  Please purchase over the counter Capsaicin cream and apply as directed.  We will be in touch with you in regards to scheduling your Flex/Sig procedure.  Thank you.

## 2016-05-02 ENCOUNTER — Telehealth: Payer: Self-pay | Admitting: Gastroenterology

## 2016-05-02 ENCOUNTER — Telehealth: Payer: Self-pay

## 2016-05-02 NOTE — Telephone Encounter (Signed)
Patient called this morning, is having increased in bloody diarrhea. Asked her to go to the ED. See phone note from today.

## 2016-05-02 NOTE — Telephone Encounter (Signed)
-----   Message from Ruffin Frederick, MD sent at 05/01/2016  5:59 PM EDT ----- Regarding: possible hospital case Sorry for all of these messages. Don't worry thought I am out of the office the next 2 days! I won't send anymore this week.  This patient needs a flex sig and I think she will do it without sedation. The issue is she has MS and can't walk. Do you think it would be possible to do this at [facility] if unsedated? She can transfer with assistance. Otherwise she will need to be done at the hospital.   Can you help coordinate and ask Baxter Hire if possible to do at the [facility]? Thanks much

## 2016-05-02 NOTE — Telephone Encounter (Signed)
Spoke to patient and advised her to go to the ED. We do not have any appointments today, Dr. Adela Lank is out of the office. Patient understands and will go to the ED.

## 2016-05-03 DIAGNOSIS — L0390 Cellulitis, unspecified: Secondary | ICD-10-CM | POA: Diagnosis not present

## 2016-05-06 ENCOUNTER — Other Ambulatory Visit: Payer: Self-pay

## 2016-05-06 ENCOUNTER — Telehealth: Payer: Self-pay | Admitting: Gastroenterology

## 2016-05-06 DIAGNOSIS — R197 Diarrhea, unspecified: Secondary | ICD-10-CM

## 2016-05-06 NOTE — Telephone Encounter (Signed)
Deborah Rangel if this patient's symptoms are persisting, I have openings this afternoon in the [facility] for flex sig if she can come in for it. She can't walk but can transfer with assistance. The exam would be done unsedated and she could have enemas in the [facility] prior to the procedure. Can you let me know? Thanks

## 2016-05-07 ENCOUNTER — Telehealth: Payer: Self-pay | Admitting: Internal Medicine

## 2016-05-07 NOTE — Telephone Encounter (Signed)
Contacted by patient to report ongoing bloody diarrhea 10 stools today, wearing depends as bowel control has been difficult No new symptoms, other than mild dizziness No change in abd pain Was seen in urgent care earlier this week/over weekend rather thank ER as advised by our RN Has flex sig scheduled for Thursday with Dr. Adela Lank  I recommended that she be seen in ER tonight to have vitals checked and CBC to ensure not severely anemic. She voiced understanding and will consider this advice  RN to check to see if she was seen in ED overnight and if not she needs to come for STAT CBC so decision can be made regarding plan of care (whether to wait for flex sig or send to the ER).

## 2016-05-08 ENCOUNTER — Other Ambulatory Visit: Payer: Self-pay

## 2016-05-08 ENCOUNTER — Other Ambulatory Visit: Payer: Self-pay | Admitting: Gastroenterology

## 2016-05-08 ENCOUNTER — Other Ambulatory Visit (INDEPENDENT_AMBULATORY_CARE_PROVIDER_SITE_OTHER): Payer: Medicare Other

## 2016-05-08 DIAGNOSIS — R197 Diarrhea, unspecified: Secondary | ICD-10-CM

## 2016-05-08 LAB — CBC WITH DIFFERENTIAL/PLATELET
Basophils Absolute: 0.1 10*3/uL (ref 0.0–0.1)
Basophils Relative: 1 % (ref 0.0–3.0)
Eosinophils Absolute: 1.2 10*3/uL — ABNORMAL HIGH (ref 0.0–0.7)
Eosinophils Relative: 14.8 % — ABNORMAL HIGH (ref 0.0–5.0)
HCT: 34.3 % — ABNORMAL LOW (ref 36.0–46.0)
Hemoglobin: 11.5 g/dL — ABNORMAL LOW (ref 12.0–15.0)
LYMPHS ABS: 1.5 10*3/uL (ref 0.7–4.0)
Lymphocytes Relative: 17.4 % (ref 12.0–46.0)
MCHC: 33.4 g/dL (ref 30.0–36.0)
MCV: 81.5 fl (ref 78.0–100.0)
MONO ABS: 0.9 10*3/uL (ref 0.1–1.0)
MONOS PCT: 10.7 % (ref 3.0–12.0)
NEUTROS ABS: 4.7 10*3/uL (ref 1.4–7.7)
NEUTROS PCT: 56.1 % (ref 43.0–77.0)
PLATELETS: 428 10*3/uL — AB (ref 150.0–400.0)
RBC: 4.21 Mil/uL (ref 3.87–5.11)
RDW: 14 % (ref 11.5–15.5)
WBC: 8.4 10*3/uL (ref 4.0–10.5)

## 2016-05-08 MED ORDER — MESALAMINE 4 G RE ENEM: 4.0000 g | ENEMA | Freq: Every day | RECTAL | 0 refills | Status: DC

## 2016-05-08 MED ORDER — MESALAMINE 1.2 G PO TBEC: 4.8000 g | DELAYED_RELEASE_TABLET | Freq: Every day | ORAL | 3 refills | Status: DC

## 2016-05-08 NOTE — Telephone Encounter (Signed)
Thanks Sable Feil can you please touch base with this patient this AM. If she has not been to the ER she needs stool for C Diff done as well as CBC. While we are awaiting flex sig, we can start her empirically on Lialda 4.8 gm / day as well as Rowasa enemas q HS given her history of reported left sided colitis to see if this helps.   Can you please let me know? Thanks

## 2016-05-08 NOTE — Telephone Encounter (Signed)
Spoke to patient, she did not go to the ED because the bloody part of the diarrhea had stopped. Still continues to have diarrhea. She will come to our lab today to do stool sample for C diff and stat CBC. Patient is scheduled for tomorrow the flex sig and questioned about doing the full prep. Magnesium citrate and enemas. Please advise.

## 2016-05-08 NOTE — Telephone Encounter (Signed)
Left message for patient on her phone, she does not need to submit stool sample and will need to do prep for flex sig.

## 2016-05-08 NOTE — Telephone Encounter (Signed)
Agree with plan as outlined. Will await flex sig, cancelled C diff stool study pending flex sig results, as this is going to be done tomorrow, suspect she may more likely have her underling colitis driving this picture. CBC shows no leukocytosis as well making C Diff less likely.

## 2016-05-09 ENCOUNTER — Ambulatory Visit (AMBULATORY_SURGERY_CENTER): Payer: Medicare Other | Admitting: Gastroenterology

## 2016-05-09 ENCOUNTER — Telehealth: Payer: Self-pay | Admitting: Gastroenterology

## 2016-05-09 VITALS — BP 127/76 | HR 96 | Temp 97.7°F | Resp 20 | Wt 180.0 lb

## 2016-05-09 DIAGNOSIS — K5130 Ulcerative (chronic) rectosigmoiditis without complications: Secondary | ICD-10-CM | POA: Diagnosis not present

## 2016-05-09 DIAGNOSIS — K529 Noninfective gastroenteritis and colitis, unspecified: Secondary | ICD-10-CM | POA: Diagnosis not present

## 2016-05-09 DIAGNOSIS — R1032 Left lower quadrant pain: Secondary | ICD-10-CM

## 2016-05-09 DIAGNOSIS — K921 Melena: Secondary | ICD-10-CM

## 2016-05-09 DIAGNOSIS — K5190 Ulcerative colitis, unspecified, without complications: Secondary | ICD-10-CM | POA: Diagnosis not present

## 2016-05-09 MED ORDER — SODIUM CHLORIDE 0.9 % IV SOLN: 500.0000 mL | INTRAVENOUS | Status: DC

## 2016-05-09 NOTE — Progress Notes (Signed)
Report given to PACU, vss 

## 2016-05-09 NOTE — Progress Notes (Signed)
Pt's states no medical or surgical changes since previsit or office visit. 

## 2016-05-09 NOTE — Telephone Encounter (Signed)
Left message for pt to return call.

## 2016-05-09 NOTE — Patient Instructions (Signed)
YOU HAD AN ENDOSCOPIC PROCEDURE TODAY AT THE [facility]:   Refer to the procedure report that was given to you for any specific questions about what was found during the examination.  If the procedure report does not answer your questions, please call your gastroenterologist to clarify.  If you requested that your care partner not be given the details of your procedure findings, then the procedure report has been included in a sealed envelope for you to review at your convenience later.  YOU SHOULD EXPECT: Some feelings of bloating in the abdomen. Passage of more gas than usual.  Walking can help get rid of the air that was put into your GI tract during the procedure and reduce the bloating. If you had a lower endoscopy (such as a colonoscopy or flexible sigmoidoscopy) you may notice spotting of blood in your stool or on the toilet paper. If you underwent a bowel prep for your procedure, you may not have a normal bowel movement for a few days.  Please Note:  You might notice some irritation and congestion in your nose or some drainage.  This is from the oxygen used during your procedure.  There is no need for concern and it should clear up in a day or so.  SYMPTOMS TO REPORT IMMEDIATELY:   Following lower endoscopy (colonoscopy or flexible sigmoidoscopy):  Excessive amounts of blood in the stool  Significant tenderness or worsening of abdominal pains  Swelling of the abdomen that is new, acute  Fever of 100F or higher  For urgent or emergent issues, a gastroenterologist can be reached at any hour by calling [phone].   DIET:  We do recommend a small meal at first, but then you may proceed to your regular diet.  Drink plenty of fluids but you should avoid alcoholic beverages for 24 hours.  ACTIVITY:  You should plan to take it easy for the rest of today and you should NOT DRIVE or use heavy machinery until tomorrow (because of the sedation medicines used during the test).     FOLLOW UP: Our staff will call the number listed on your records the next business day following your procedure to check on you and address any questions or concerns that you may have regarding the information given to you following your procedure. If we do not reach you, we will leave a message.  However, if you are feeling well and you are not experiencing any problems, there is no need to return our call.  We will assume that you have returned to your regular daily activities without incident.  If any biopsies were taken you will be contacted by phone or by letter within the next 1-3 weeks.  Please call us at [phone] if you have not heard about the biopsies in 3 weeks.    SIGNATURES/CONFIDENTIALITY: You and/or your care partner have signed paperwork which will be entered into your electronic medical record.  These signatures attest to the fact that that the information above on your After Visit Summary has been reviewed and is understood.  Full responsibility of the confidentiality of this discharge information lies with you and/or your care-partner. 

## 2016-05-09 NOTE — Telephone Encounter (Signed)
Spoke with pt. She has already picked up medications and has no concerns about how to take/use them.

## 2016-05-09 NOTE — Op Note (Signed)
[facility] Patient Name: Deborah Rangel Procedure Date: 05/09/2016 8:25 AM MRN: [id number] Endoscopist: Viviann Spare P. Jaquesha Boroff MD, MD Age: 61 Referring MD:  Date of Birth: [birth year] Gender: Female Account #: [id number] Procedure:                Flexible Sigmoidoscopy Indications:              Hematochezia, Diarrhea, history of colitis                            currently untreated Medicines:                None Procedure:                Pre-Anesthesia Assessment:                           - Prior to the procedure, a History and Physical                            was performed, and patient medications and                            allergies were reviewed. The patient's tolerance of                            previous anesthesia was also reviewed. The risks                            and benefits of the procedure and the sedation                            options and risks were discussed with the patient.                            All questions were answered, and informed consent                            was obtained. Prior Anticoagulants: The patient has                            taken no previous anticoagulant or antiplatelet                            agents. ASA Grade Assessment: III - A patient with                            severe systemic disease. After reviewing the risks                            and benefits, the patient was deemed in                            satisfactory condition to undergo the procedure.  After obtaining informed consent, the scope was                            passed under direct vision. The Model PCF-H190DL                            [id number]) scope was introduced through the anus                            and advanced to the the sigmoid colon. The flexible                            sigmoidoscopy was accomplished without difficulty.                            The patient tolerated the procedure well. The                            quality of the bowel preparation was adequate. Scope In: Scope Out: Findings:                 The perianal and digital rectal examinations were                            normal.                           Inflammation characterized by adherent blood,                            congestion (edema), erythema, friability,                            granularity, loss of vascularity and mucus was                            found in a continuous and circumferential pattern                            from the rectum to the sigmoid colon. No sites were                            spared. This was moderate in severity. Biopsies                            were taken with a cold forceps for histology. Complications:            No immediate complications. Estimated blood loss:                            Minimal. Estimated Blood Loss:     Estimated blood loss was minimal. Impression:               - Inflammation was found from the rectum to the  sigmoid colon. This was moderate in severity                            consistent with active ulcerative colitis. Biopsied. Recommendation:           - Discharge patient to home.                           - Resume previous diet.                           - Continue present medications.                           - Start Lialda 4.8 gm / day                           - Start Rowasa enemas q HS                           - Await pathology results                           - Call next week if no improvement in symptoms [name] P. Milayah Krell MD, MD 05/09/2016 8:42:16 AM This report has been signed electronically.

## 2016-05-09 NOTE — Progress Notes (Signed)
Called to room to assist during endoscopic procedure.  Patient ID and intended procedure confirmed with present staff. Received instructions for my participation in the procedure from the performing physician.  

## 2016-05-10 ENCOUNTER — Telehealth: Payer: Self-pay | Admitting: *Deleted

## 2016-05-10 ENCOUNTER — Telehealth: Payer: Self-pay

## 2016-05-10 DIAGNOSIS — G35 Multiple sclerosis: Secondary | ICD-10-CM | POA: Diagnosis not present

## 2016-05-10 DIAGNOSIS — Z9181 History of falling: Secondary | ICD-10-CM | POA: Diagnosis not present

## 2016-05-10 DIAGNOSIS — I872 Venous insufficiency (chronic) (peripheral): Secondary | ICD-10-CM | POA: Diagnosis not present

## 2016-05-10 DIAGNOSIS — M130 Polyarthritis, unspecified: Secondary | ICD-10-CM | POA: Diagnosis not present

## 2016-05-10 DIAGNOSIS — M25552 Pain in left hip: Secondary | ICD-10-CM | POA: Diagnosis not present

## 2016-05-10 NOTE — Telephone Encounter (Signed)
No answer, left message to call if questions or concerns. 

## 2016-05-10 NOTE — Telephone Encounter (Signed)
  Follow up Call-  Call back number 05/09/2016  Post procedure Call Back phone  # [phone]  Permission to leave phone message Yes     Patient questions:  Do you have a fever, pain , or abdominal swelling? No. Pain Score  0 *  Have you tolerated food without any problems? Yes.    Have you been able to return to your normal activities? Yes.    Do you have any questions about your discharge instructions: Diet   No. Medications  No. Follow up visit  No.  Do you have questions or concerns about your Care? No.  Actions: * If pain score is 4 or above: No action needed, pain <4.

## 2016-05-13 NOTE — Telephone Encounter (Signed)
Mia @ [facility] is calling back because she wants to be clear if the patient is supposed to be on the tablet and enema Mesalamine. Contact # [phone].

## 2016-05-14 ENCOUNTER — Telehealth: Payer: Self-pay | Admitting: Gastroenterology

## 2016-05-14 NOTE — Telephone Encounter (Signed)
Deborah Rangel can you please send a stool study for her to rule out C diff, hopefully she can submit this tomorrow, ensure no concomitant infection. If this is negative will plan on starting her on prednisone 40mg  once daily for treatment of her colitis if she is not responding to therapy as outlined. Agree with Rowasa every night which should help. Thanks

## 2016-05-14 NOTE — Telephone Encounter (Signed)
Patient just returned my call, she is having 4-5 episodes of diarrhea a day, she even has had accidents in the middle of the night. She states she is taking the Lialda 4.8 gm at breakfast and has used the Rowasa enema's a "couple" of times. I asked her if when she used them at night, did she have accidents. She did state that this did help at night. Encouraged her to continue using this every night. She wanted to know if she could take anything else to help during the day.

## 2016-05-15 ENCOUNTER — Telehealth: Payer: Self-pay

## 2016-05-15 ENCOUNTER — Other Ambulatory Visit: Payer: Self-pay

## 2016-05-15 DIAGNOSIS — R197 Diarrhea, unspecified: Secondary | ICD-10-CM

## 2016-05-15 NOTE — Telephone Encounter (Signed)
Spoke to patient asking her to come do a stool specimen to check for C diff. She states that the diarrhea has stopped and now just her normal bm. She did do the Rowasa enema last night and that helped. I told her that the order is out there if needed, asked her to call and let us know if diarrhea starts back. Otherwise, she feels better.

## 2016-05-15 NOTE — Telephone Encounter (Signed)
Okay good to hear it. Thanks

## 2016-05-15 NOTE — Telephone Encounter (Signed)
Patient wanted to know if there is anything else to help with the left sided abdominal pain, she states that the gabapentin is not helping.

## 2016-05-16 ENCOUNTER — Other Ambulatory Visit: Payer: Self-pay

## 2016-05-16 ENCOUNTER — Encounter: Payer: Self-pay | Admitting: Gastroenterology

## 2016-05-16 DIAGNOSIS — M130 Polyarthritis, unspecified: Secondary | ICD-10-CM | POA: Diagnosis not present

## 2016-05-16 DIAGNOSIS — G35 Multiple sclerosis: Secondary | ICD-10-CM | POA: Diagnosis not present

## 2016-05-16 DIAGNOSIS — M25552 Pain in left hip: Secondary | ICD-10-CM | POA: Diagnosis not present

## 2016-05-16 DIAGNOSIS — Z9181 History of falling: Secondary | ICD-10-CM | POA: Diagnosis not present

## 2016-05-16 DIAGNOSIS — I872 Venous insufficiency (chronic) (peripheral): Secondary | ICD-10-CM | POA: Diagnosis not present

## 2016-05-16 NOTE — Telephone Encounter (Signed)
This pain has been present for years and is chronic. I started her on gabapentin 300mg  qHS, but asked her to titrate up to 600mg  qHS after a week. Has she been doing this yet? Otherwise I had recommended topical capsaicin cream to be applied to the affected area 3 times daily. This is OTC product, has she tried this yet? If not I would ask she do both of these measures first. Also, will see if treatment of her colitis will help any of her pain symptom as well. Thanks

## 2016-05-16 NOTE — Telephone Encounter (Signed)
Patient advised of recommendations, she is taking 600 mg of gabapentin qHS. She will try the OTC capsaicin. Patient today states that she is having diarrhea. Advised to go give stool sample, order in Epic for C diff.

## 2016-05-22 DIAGNOSIS — M130 Polyarthritis, unspecified: Secondary | ICD-10-CM | POA: Diagnosis not present

## 2016-05-22 DIAGNOSIS — G35 Multiple sclerosis: Secondary | ICD-10-CM | POA: Diagnosis not present

## 2016-05-22 DIAGNOSIS — M25552 Pain in left hip: Secondary | ICD-10-CM | POA: Diagnosis not present

## 2016-05-22 DIAGNOSIS — Z9181 History of falling: Secondary | ICD-10-CM | POA: Diagnosis not present

## 2016-05-22 DIAGNOSIS — I872 Venous insufficiency (chronic) (peripheral): Secondary | ICD-10-CM | POA: Diagnosis not present

## 2016-05-24 DIAGNOSIS — Z9181 History of falling: Secondary | ICD-10-CM | POA: Diagnosis not present

## 2016-05-24 DIAGNOSIS — M25552 Pain in left hip: Secondary | ICD-10-CM | POA: Diagnosis not present

## 2016-05-24 DIAGNOSIS — M130 Polyarthritis, unspecified: Secondary | ICD-10-CM | POA: Diagnosis not present

## 2016-05-24 DIAGNOSIS — I872 Venous insufficiency (chronic) (peripheral): Secondary | ICD-10-CM | POA: Diagnosis not present

## 2016-05-24 DIAGNOSIS — G35 Multiple sclerosis: Secondary | ICD-10-CM | POA: Diagnosis not present

## 2016-05-27 DIAGNOSIS — Z9181 History of falling: Secondary | ICD-10-CM | POA: Diagnosis not present

## 2016-05-27 DIAGNOSIS — M130 Polyarthritis, unspecified: Secondary | ICD-10-CM | POA: Diagnosis not present

## 2016-05-27 DIAGNOSIS — G35 Multiple sclerosis: Secondary | ICD-10-CM | POA: Diagnosis not present

## 2016-05-27 DIAGNOSIS — I872 Venous insufficiency (chronic) (peripheral): Secondary | ICD-10-CM | POA: Diagnosis not present

## 2016-05-27 DIAGNOSIS — M25552 Pain in left hip: Secondary | ICD-10-CM | POA: Diagnosis not present

## 2016-05-28 NOTE — Telephone Encounter (Signed)
Done

## 2016-05-29 ENCOUNTER — Telehealth: Payer: Self-pay | Admitting: Gastroenterology

## 2016-05-29 NOTE — Telephone Encounter (Signed)
Raynelle Fanning can you please help clarify a few things: - this patient has chronic abdominal pain, I suspect related to abdominal wall pain - she also has ulcerative colitis which was flaring recently - the Gabapentin was to treat her chronic abdominal wall pain. She should be taking 600mg  qHS and be taking topical capsaicin cream if she has not tried this as well - she should be on Lialda 4.8gm / day and Rowasa enemas for her colitis. Is her diarrhea and rectal bleeding improved. If this is improved and her bowels are normal I don't think her colitis is causing her pain.   Can you clarify with her? thanks

## 2016-05-30 DIAGNOSIS — M130 Polyarthritis, unspecified: Secondary | ICD-10-CM | POA: Diagnosis not present

## 2016-05-30 DIAGNOSIS — M25552 Pain in left hip: Secondary | ICD-10-CM | POA: Diagnosis not present

## 2016-05-30 DIAGNOSIS — I872 Venous insufficiency (chronic) (peripheral): Secondary | ICD-10-CM | POA: Diagnosis not present

## 2016-05-30 DIAGNOSIS — Z9181 History of falling: Secondary | ICD-10-CM | POA: Diagnosis not present

## 2016-05-30 DIAGNOSIS — G35 Multiple sclerosis: Secondary | ICD-10-CM | POA: Diagnosis not present

## 2016-05-30 NOTE — Telephone Encounter (Signed)
Left message for patient to call back  

## 2016-05-31 ENCOUNTER — Telehealth: Payer: Self-pay | Admitting: Gastroenterology

## 2016-05-31 DIAGNOSIS — G35 Multiple sclerosis: Secondary | ICD-10-CM | POA: Diagnosis not present

## 2016-05-31 NOTE — Telephone Encounter (Signed)
Ok, good to know, thanks for the follow up

## 2016-05-31 NOTE — Telephone Encounter (Signed)
Spoke to patient, she states that the medication, Lialda and Rowasa have completely improved the diarrhea/rectal bleeding. She has not yet tried the capsaicin topical cream for abdominal pain. She will try the topical cream and will call back if not better.

## 2016-06-03 DIAGNOSIS — M25552 Pain in left hip: Secondary | ICD-10-CM | POA: Diagnosis not present

## 2016-06-03 DIAGNOSIS — I872 Venous insufficiency (chronic) (peripheral): Secondary | ICD-10-CM | POA: Diagnosis not present

## 2016-06-03 DIAGNOSIS — G35 Multiple sclerosis: Secondary | ICD-10-CM | POA: Diagnosis not present

## 2016-06-03 DIAGNOSIS — M130 Polyarthritis, unspecified: Secondary | ICD-10-CM | POA: Diagnosis not present

## 2016-06-03 DIAGNOSIS — Z9181 History of falling: Secondary | ICD-10-CM | POA: Diagnosis not present

## 2016-06-06 DIAGNOSIS — I872 Venous insufficiency (chronic) (peripheral): Secondary | ICD-10-CM | POA: Diagnosis not present

## 2016-06-06 DIAGNOSIS — M130 Polyarthritis, unspecified: Secondary | ICD-10-CM | POA: Diagnosis not present

## 2016-06-06 DIAGNOSIS — G35 Multiple sclerosis: Secondary | ICD-10-CM | POA: Diagnosis not present

## 2016-06-06 DIAGNOSIS — M25552 Pain in left hip: Secondary | ICD-10-CM | POA: Diagnosis not present

## 2016-06-06 DIAGNOSIS — Z9181 History of falling: Secondary | ICD-10-CM | POA: Diagnosis not present

## 2016-06-10 DIAGNOSIS — I872 Venous insufficiency (chronic) (peripheral): Secondary | ICD-10-CM | POA: Diagnosis not present

## 2016-06-10 DIAGNOSIS — G35 Multiple sclerosis: Secondary | ICD-10-CM | POA: Diagnosis not present

## 2016-06-10 DIAGNOSIS — M25552 Pain in left hip: Secondary | ICD-10-CM | POA: Diagnosis not present

## 2016-06-10 DIAGNOSIS — Z9181 History of falling: Secondary | ICD-10-CM | POA: Diagnosis not present

## 2016-06-10 DIAGNOSIS — M130 Polyarthritis, unspecified: Secondary | ICD-10-CM | POA: Diagnosis not present

## 2016-06-12 DIAGNOSIS — G35 Multiple sclerosis: Secondary | ICD-10-CM | POA: Diagnosis not present

## 2016-06-12 DIAGNOSIS — Z9181 History of falling: Secondary | ICD-10-CM | POA: Diagnosis not present

## 2016-06-12 DIAGNOSIS — I872 Venous insufficiency (chronic) (peripheral): Secondary | ICD-10-CM | POA: Diagnosis not present

## 2016-06-12 DIAGNOSIS — M130 Polyarthritis, unspecified: Secondary | ICD-10-CM | POA: Diagnosis not present

## 2016-06-12 DIAGNOSIS — M25552 Pain in left hip: Secondary | ICD-10-CM | POA: Diagnosis not present

## 2016-06-14 ENCOUNTER — Encounter: Payer: Self-pay | Admitting: Neurology

## 2016-06-14 ENCOUNTER — Ambulatory Visit (INDEPENDENT_AMBULATORY_CARE_PROVIDER_SITE_OTHER): Payer: Medicare Other | Admitting: Neurology

## 2016-06-14 VITALS — BP 88/58 | HR 88 | Temp 98.0°F | Resp 16

## 2016-06-14 DIAGNOSIS — G35 Multiple sclerosis: Secondary | ICD-10-CM

## 2016-06-14 DIAGNOSIS — N319 Neuromuscular dysfunction of bladder, unspecified: Secondary | ICD-10-CM | POA: Diagnosis not present

## 2016-06-14 NOTE — Progress Notes (Signed)
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     UPDATE: Overall, everything is stable.  She reports left groin pain.  She had a previous mass of her left ovary, but recent ultrasound did not show anything.  She does have left lower back pain.  MRI of lumbar spine from 08/29/15 was personally reviewed and revealed mild disc bulge at L4-5 but nothing that would explain groin pain.  She was referred to a pain specialist for possible ilioinguinal nerve block.  It was eventually discovered to be due to a UC flareup.  She is taking Lialda.  She currently takes sertraline 100mg  and lorazepam 0.5mg  for depression. She takes Ambien to help her sleep.     HISTORY: Around 2006, she developed numbness in her mid to lower back.  She was evaluated by a neurologist at that time.  MRI of the brain and cervical spine were performed and reportedly revealed abnormal signal and enhancement.  A lumbar puncture was reportedly positive for oligoclonal bands.  Serum ANA was negative.  She was treated with IV Solu-Medrol, which treated the symptoms but caused steroid psychosis  She was started on Copaxone.  Over the next several years, she has had a gradual progression of symptoms.  She reports that sometimes there would be fluctuation in her foot weakness, but overall  without acute episodes of flare-up.  These symptoms include worsening gait.  For the past two years she is mostly in a wheelchair and rarely uses the walker.  She has both an overactive bladder and bladder retention.  She reports some mild fatigue.  She denies optic neuritis, muscle spasms, or neuropathic pain.  She discontinued Copaxone in 2012.     MRI of the brain with and without contrast performed in January 2016 showed confluent  non-enhancing T2 hyperintense lesions, including the cerebellar peduncle, right thalamus and splenium of corpus callosum.  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   Ritalin and Nuvigil for fatigue were ineffective.  Ampyra for gait was ineffective.  PAST MEDICAL HISTORY: Past Medical History:  Diagnosis Date  . Hypoglycemia   . Mild chronic ulcerative colitis (HCC)   . Multiple sclerosis (HCC)   . Obesity   . Panic disorder without agoraphobia   . Pure hypercholesterolemia     MEDICATIONS: Current Outpatient Prescriptions on File Prior to Visit  Medication Sig Dispense Refill  . gabapentin (NEURONTIN) 300 MG capsule Take 1 capsule (300 mg total) by mouth at bedtime. 168 capsule 0  . LORazepam (ATIVAN) 1 MG tablet TAKE 1/2 TO 1 TABLET BY MOUTH THREE TIMES DAILY AS NEEDED FOR ANXIETY  1  . pravastatin (PRAVACHOL) 40 MG tablet Take 80 mg by mouth at bedtime.   0  . sertraline (ZOLOFT) 100 MG tablet Take 200 mg by mouth daily.  0  . zolpidem (AMBIEN) 10 MG tablet Take 10 mg by mouth at bedtime as needed. for sleep  1  . mesalamine (LIALDA) 1.2 g EC tablet Take 4 tablets (4.8 g total) by mouth daily with breakfast. 120 tablet 3  . mesalamine (ROWASA) 4 g enema Place 60 mLs (4 g total) rectally at bedtime. (Patient not taking: Reported on 06/14/2016) 30 Bottle 0   Current Facility-Administered Medications on File Prior to Visit  Medication Dose Route Frequency Provider Last Rate Last Dose  . 0.9 %  sodium chloride infusion  500 mL Intravenous Continuous Ruffin Frederick, MD        ALLERGIES: Allergies  Allergen Reactions  . Codeine Shortness Of Breath  . Oxycodone Other (See Comments)    confusion    FAMILY HISTORY: No family history on file.  SOCIAL HISTORY: Social History   Social History  . Marital status: Married    Spouse name: N/A  . Number of children: N/A  . Years of education: N/A   Occupational History  . Not  on file.   Social History Main Topics  . Smoking status: Never Smoker  . Smokeless tobacco: Never Used  . Alcohol use No  . Drug use: No  . Sexual activity: Yes    Partners: Male   Other Topics Concern  . Not on file   Social History Narrative  . No narrative on file    REVIEW OF SYSTEMS: Constitutional: No fevers, chills, or sweats, no generalized fatigue, change in appetite Eyes: No visual changes, double vision, eye pain Ear, nose and throat: No hearing loss, ear pain, nasal congestion, sore throat Cardiovascular: No chest pain, palpitations Respiratory:  No shortness of breath at rest or with exertion, wheezes GastrointestinaI: No nausea, vomiting, diarrhea, abdominal pain, fecal incontinence Genitourinary:  UTI Musculoskeletal:  No neck pain, back pain Integumentary: No rash, pruritus, skin lesions Neurological: as above Psychiatric: No depression, insomnia, anxiety Endocrine: No palpitations, fatigue, diaphoresis, mood swings, change in appetite, change in weight, increased thirst Hematologic/Lymphatic:  No purpura, petechiae. Allergic/Immunologic: no itchy/runny eyes, nasal congestion, recent allergic reactions, rashes  PHYSICAL EXAM: Vitals:   06/14/16 0921  BP: (!) 88/58  Pulse: 88  Resp: 16  Temp: 98 F (36.7 C)   General: No acute distress.  Patient appears well-groomed.  normal body habitus. Head:  Normocephalic/atraumatic Eyes:  Fundi examined but not visualized Neck: supple, no paraspinal tenderness, full range of motion Heart:  Regular rate and rhythm Lungs:  Clear to auscultation bilaterally Back: No paraspinal tenderness Neurological Exam: alert and oriented to person, place, and time, recent and remote memory intact, fund of knowledge intact, attention and concentration intact, speech fluent and not dysarthric, language intact.  CN II-XII intact.  Increased tone in the legs.  Muscle strength 5-/5 left triceps and biceps, 3-/5 right hip flexion, 2+/5  left hip flexion, 5-/5 right hamstring, 4+/5 left hamstring, 2-3/5 quads, 2/5 left ankle dorsiflexion.  Otherwise 5/5.  Reduced pinprick sensation on dorsum of left foot.  Vibration sensation intact.  2+ in upper extremities, 3+ in lower extremities (greater on the left), bilateral Babinski.  Cannot stand without assistance.    IMPRESSION: Secondary progressive multiple sclerosis, stable  PLAN: Follow up in one year or as needed.  21 minutes spent face to face with patient, over 50% spent discussing management.  Shon Millet, DO  CC: Gwendlyn Deutscher II, MD

## 2016-06-14 NOTE — Progress Notes (Signed)
Done

## 2016-06-14 NOTE — Patient Instructions (Signed)
Follow-up in one year.

## 2016-06-17 DIAGNOSIS — M25552 Pain in left hip: Secondary | ICD-10-CM | POA: Diagnosis not present

## 2016-06-17 DIAGNOSIS — M130 Polyarthritis, unspecified: Secondary | ICD-10-CM | POA: Diagnosis not present

## 2016-06-17 DIAGNOSIS — Z9181 History of falling: Secondary | ICD-10-CM | POA: Diagnosis not present

## 2016-06-17 DIAGNOSIS — I872 Venous insufficiency (chronic) (peripheral): Secondary | ICD-10-CM | POA: Diagnosis not present

## 2016-06-17 DIAGNOSIS — G35 Multiple sclerosis: Secondary | ICD-10-CM | POA: Diagnosis not present

## 2016-06-21 DIAGNOSIS — M25552 Pain in left hip: Secondary | ICD-10-CM | POA: Diagnosis not present

## 2016-06-21 DIAGNOSIS — I872 Venous insufficiency (chronic) (peripheral): Secondary | ICD-10-CM | POA: Diagnosis not present

## 2016-06-21 DIAGNOSIS — Z9181 History of falling: Secondary | ICD-10-CM | POA: Diagnosis not present

## 2016-06-21 DIAGNOSIS — G35 Multiple sclerosis: Secondary | ICD-10-CM | POA: Diagnosis not present

## 2016-06-21 DIAGNOSIS — M130 Polyarthritis, unspecified: Secondary | ICD-10-CM | POA: Diagnosis not present

## 2016-06-25 ENCOUNTER — Telehealth: Payer: Self-pay | Admitting: Gastroenterology

## 2016-06-25 DIAGNOSIS — M25552 Pain in left hip: Secondary | ICD-10-CM | POA: Diagnosis not present

## 2016-06-25 DIAGNOSIS — M130 Polyarthritis, unspecified: Secondary | ICD-10-CM | POA: Diagnosis not present

## 2016-06-25 DIAGNOSIS — I872 Venous insufficiency (chronic) (peripheral): Secondary | ICD-10-CM | POA: Diagnosis not present

## 2016-06-25 DIAGNOSIS — Z9181 History of falling: Secondary | ICD-10-CM | POA: Diagnosis not present

## 2016-06-25 DIAGNOSIS — G35 Multiple sclerosis: Secondary | ICD-10-CM | POA: Diagnosis not present

## 2016-06-25 NOTE — Telephone Encounter (Signed)
Pharmacy gave pt #90 when she picked up her last script on 05/01/16. She now takes 600mg  qhs. Are you ok to refill? Pt made aware that you are out of the office until tomorrow.

## 2016-06-26 ENCOUNTER — Other Ambulatory Visit: Payer: Self-pay

## 2016-06-26 MED ORDER — GABAPENTIN 600 MG PO TABS: 600.0000 mg | ORAL_TABLET | Freq: Every day | ORAL | 3 refills | Status: DC

## 2016-06-26 NOTE — Telephone Encounter (Signed)
If this is working to treat her pain and she feels better on it, then yes we can refill it and continue dosing at 600mg  qHS. Give 3 month supply with 3 refills. If it is not helping please let me know. Thanks

## 2016-06-26 NOTE — Telephone Encounter (Signed)
Medication sent.

## 2016-06-27 DIAGNOSIS — I872 Venous insufficiency (chronic) (peripheral): Secondary | ICD-10-CM | POA: Diagnosis not present

## 2016-06-27 DIAGNOSIS — G35 Multiple sclerosis: Secondary | ICD-10-CM | POA: Diagnosis not present

## 2016-06-27 DIAGNOSIS — Z9181 History of falling: Secondary | ICD-10-CM | POA: Diagnosis not present

## 2016-06-27 DIAGNOSIS — M130 Polyarthritis, unspecified: Secondary | ICD-10-CM | POA: Diagnosis not present

## 2016-06-27 DIAGNOSIS — M25552 Pain in left hip: Secondary | ICD-10-CM | POA: Diagnosis not present

## 2016-07-01 ENCOUNTER — Other Ambulatory Visit (INDEPENDENT_AMBULATORY_CARE_PROVIDER_SITE_OTHER): Payer: Medicare Other

## 2016-07-01 ENCOUNTER — Encounter: Payer: Self-pay | Admitting: Gastroenterology

## 2016-07-01 ENCOUNTER — Ambulatory Visit (INDEPENDENT_AMBULATORY_CARE_PROVIDER_SITE_OTHER): Payer: Medicare Other | Admitting: Gastroenterology

## 2016-07-01 VITALS — BP 90/60 | HR 84

## 2016-07-01 DIAGNOSIS — G35 Multiple sclerosis: Secondary | ICD-10-CM | POA: Diagnosis not present

## 2016-07-01 DIAGNOSIS — Z9181 History of falling: Secondary | ICD-10-CM | POA: Diagnosis not present

## 2016-07-01 DIAGNOSIS — R109 Unspecified abdominal pain: Secondary | ICD-10-CM

## 2016-07-01 DIAGNOSIS — M25552 Pain in left hip: Secondary | ICD-10-CM | POA: Diagnosis not present

## 2016-07-01 DIAGNOSIS — M130 Polyarthritis, unspecified: Secondary | ICD-10-CM | POA: Diagnosis not present

## 2016-07-01 DIAGNOSIS — K5150 Left sided colitis without complications: Secondary | ICD-10-CM

## 2016-07-01 DIAGNOSIS — K51919 Ulcerative colitis, unspecified with unspecified complications: Secondary | ICD-10-CM

## 2016-07-01 DIAGNOSIS — I872 Venous insufficiency (chronic) (peripheral): Secondary | ICD-10-CM | POA: Diagnosis not present

## 2016-07-01 LAB — BASIC METABOLIC PANEL
BUN: 25 mg/dL — ABNORMAL HIGH (ref 6–23)
CALCIUM: 9.7 mg/dL (ref 8.4–10.5)
CO2: 31 mEq/L (ref 19–32)
CREATININE: 0.8 mg/dL (ref 0.40–1.20)
Chloride: 104 mEq/L (ref 96–112)
GFR: 77.89 mL/min (ref >60.00)
Glucose, Bld: 82 mg/dL (ref 70–99)
Potassium: 4.2 mEq/L (ref 3.5–5.1)
SODIUM: 140 meq/L (ref 135–145)

## 2016-07-01 MED ORDER — DICYCLOMINE HCL 10 MG PO CAPS: 10.0000 mg | ORAL_CAPSULE | Freq: Three times a day (TID) | ORAL | 3 refills | Status: DC | PRN

## 2016-07-01 MED ORDER — MESALAMINE 1.2 G PO TBEC: 4.8000 g | DELAYED_RELEASE_TABLET | Freq: Two times a day (BID) | ORAL | 3 refills | Status: DC

## 2016-07-01 NOTE — Progress Notes (Signed)
HPI :  LAST VISIT: 61 y/o female with a history of reported UC diagnosed in 2013, multiple sclerosis (wheelchair bound), obesity, here for further evaluation and a second opinion for chronic left lower quadrant pain.  Patient reports pain in the LLQ for "years", unclear how long, at least a few years. Pain in the LLQ to left inguinal area. Pain is present most of the time, but severity can fluctuate. Pain present 24/7. Mildest pain rated 3-4/10, and when it at it's worst, 8-9/10. Severe pain can be once or twice per day, maybe a minute. Sharp stabbing pain, no burning pain. Having a bowel movement makes it worse, but she thinks anything straining the abdominal wall can make it worse. It is very tender to the touch. Eating does not make it worse. She has some nausea, but no vomiting. She has some occasional rectal bleeding. She has had some loose stools intermittently. She has a history of UC, diagnosed around 2013, reportedly left sided up to 30cm based on colonoscopy below. She has been off all therapy since her last colonoscopy when her exam was normal. She thinks she was having pain at the time of her last colonoscopy. Weight is stable.   She had an ex-lap about 2 months ago per general surgery, no abnormality was noted per patient to cause her pain. No hernias. She thinks she has had a prior trigger point injection which it did not help. She also states she had the nerve "cut" without relief.   No FH of CRC.  Prior workup has included: Negative CT abdomen / pelvis with contrast 07/14/15 CT abdomen / pelvis with contrast 12/19/14 - mild wall thickening of left colon, , left adnexal 1cm lesion (seen by GYN for this) Colonoscopy 01/02/2015 - mild erythema up to 30cm, otherwise normal exam to TI - path shows mild inflammation with "mild architectural distortion EGD 2013 - report not available Xray pelvis 09/2015 normal Ex-lap as above, negative  Labs 10/20/15 - ALT 25, AST 19, AP 81, T bil  0.5 - renal function normal - WBC 8.0, Hgb 12.0, MCV 83, Plt 272  SINCE LAST VISIT:  Started on gabapentin and increased to 669m daily for abdominal wall pain. Also recommended topical capsaicin cream. She doesn't think the gabapentin has helped the pain too much. She reports the topical capsaicin cream hasn't helped either.  The pain is persistent and feels it most of the time. Not burning pain or sharp at this time. She rates it normally 3-4/10, sometimes can be worse with a bowel movement. She can't think of anything that makes it worse. She is not sure if positional changes make it better or worse, she thinks some movements. Not worse with seat beat (in wheelchair).  She does think treating the colitis has helped her pain slightly but largely not improved.   Flex sig done on 05/07/16 - active UC throughout left colon - chronic active colitis noted on path, Started on Lialda 4.8 gm / day as well as Rowasa enemas.   She reports her bowels have improved and now back to normal. Taking Lialda 4 tabs per day. She has stopped the enemas. Prior to the procedure she had a flare of symptoms with frequent loose stools and incontinence. She reports this has resolved. She is having roughly one BM, sometimes 2 per day. She things stools are formed. No blood in the stools.     Past Medical History:  Diagnosis Date  . Hypoglycemia   . Mild  chronic ulcerative colitis ([place])   . Multiple sclerosis ([facility])   . Obesity   . Panic disorder without agoraphobia   . Pure hypercholesterolemia      Past Surgical History:  Procedure Laterality Date  . arthroscopic knee surgery- left    . HYSTEROTOMY    . lower back      History reviewed. No pertinent family history. Social History  Substance Use Topics  . Smoking status: Never Smoker  . Smokeless tobacco: Never Used  . Alcohol use No   Current Outpatient Prescriptions  Medication Sig Dispense Refill  . buPROPion (WELLBUTRIN XL) 300 MG 24 hr tablet  Take 300 mg by mouth every morning.  0  . citalopram (CELEXA) 20 MG tablet Take 20 mg by mouth daily.  1  . gabapentin (NEURONTIN) 300 MG capsule Take 1 capsule (300 mg total) by mouth at bedtime. 168 capsule 0  . gabapentin (NEURONTIN) 600 MG tablet Take 1 tablet (600 mg total) by mouth at bedtime. 90 tablet 3  . LORazepam (ATIVAN) 1 MG tablet TAKE 1/2 TO 1 TABLET BY MOUTH THREE TIMES DAILY AS NEEDED FOR ANXIETY  1  . mesalamine (LIALDA) 1.2 g EC tablet Take 4 tablets (4.8 g total) by mouth daily with breakfast. 120 tablet 3  . pravastatin (PRAVACHOL) 40 MG tablet Take 80 mg by mouth at bedtime.   0  . zolpidem (AMBIEN) 10 MG tablet Take 10 mg by mouth at bedtime as needed. for sleep  1   Current Facility-Administered Medications  Medication Dose Route Frequency Provider Last Rate Last Dose  . 0.9 %  sodium chloride infusion  500 mL Intravenous Continuous Armbruster, Renelda Loma, MD       Allergies  Allergen Reactions  . Codeine Shortness Of Breath  . Oxycodone Other (See Comments)    confusion     Review of Systems: All systems reviewed and negative except where noted in HPI.   Lab Results  Component Value Date   WBC 8.4 05/08/2016   HGB 11.5 (L) 05/08/2016   HCT 34.3 (L) 05/08/2016   MCV 81.5 05/08/2016   PLT 428.0 (H) 05/08/2016    Lab Results  Component Value Date   CREATININE 0.80 07/01/2016   BUN 25 (H) 07/01/2016   NA 140 07/01/2016   K 4.2 07/01/2016   CL 104 07/01/2016   CO2 31 07/01/2016    No results found for: ALT, AST, GGT, ALKPHOS, BILITOT   Physical Exam: BP 90/60 (BP Location: Left Arm, Patient Position: Sitting, Cuff Size: Normal)   Pulse 84  Constitutional: Pleasant, female in no acute distress, sitting in wheelchair HEENT: Normocephalic and atraumatic. Conjunctivae are normal. No scleral icterus. Neck supple.  Cardiovascular: Normal rate, regular rhythm.  Pulmonary/chest: Effort normal and breath sounds normal. No wheezing, rales or  rhonchi. Abdominal: Soft, nondistended, focal tenderness in LLQ / left inguinal area. There are no masses palpable. No hepatomegaly. Extremities: no edema Lymphadenopathy: No cervical adenopathy noted. Neurological: Alert and oriented to person place and time. Skin: Skin is warm and dry. No rashes noted. Psychiatric: Normal mood and affect. Behavior is normal.   ASSESSMENT AND PLAN: 61 year old female here for reassessment following issues:  Left-sided ulcerative colitis - at last visit was not on any therapy, had flare of symptoms, firmed with flexible sigmoidoscopy, treated with Lialda and doing quite well at this time with resolution of her bowel changes. Decreased Lialda to maintenance dosing at 2.4 g per day, she can use Rowasa as needed. We'll  check be met to ensure stable renal function on this regimen. We'll give her a trial of Bentyl to see if it helps her abdominal pain, although suspect it is not related to her colitis given exam findings and history. Given relatively recent diagnosis colitis she does not warrant frequent surveillance at this time. She can see me again in 6-12 months for reassessment of this issue if otherwise stable.   Abdominal wall pain - suspect her chronic pain is described above is related to abdominal wall etiology. Extensive evaluation included imaging and exploratory laparoscopy as above. I discussed this at length with her. Gabapentin has not helped at all, thus we will stop it. Again will try offering Bentyl but don't think this will help too much. Discussed potentially adding a TCA versus referral to pain management for trigger point injection, she wished to hold off on these, pain is chronic but mild. I reassured her.   [place] Cellar, MD [facility] Pager [phone]

## 2016-07-01 NOTE — Patient Instructions (Signed)
If you are age 60 or older, your body mass index should be between 23-30. Your There is no height or weight on file to calculate BMI. If this is out of the aforementioned range listed, please consider follow up with your Primary Care Provider.  If you are age 67 or younger, your body mass index should be between 19-25. Your There is no height or weight on file to calculate BMI. If this is out of the aformentioned range listed, please consider follow up with your Primary Care Provider.   Your physician has requested that you go to the basement for the following lab work before leaving today:  BMET  We have sent the following medications to your pharmacy for you to pick up at your convenience:  Lialda  Bentyl  Please discontine Gabapentin.  Please follow up with Dr. Adela Lank in 6 months. You will be contacted when it is time to schedule this.  Thank you.

## 2016-07-02 ENCOUNTER — Encounter: Payer: Self-pay | Admitting: Gastroenterology

## 2016-07-02 ENCOUNTER — Telehealth: Payer: Self-pay

## 2016-07-02 ENCOUNTER — Other Ambulatory Visit: Payer: Self-pay

## 2016-07-02 MED ORDER — MESALAMINE 1.2 G PO TBEC: 2.4000 g | DELAYED_RELEASE_TABLET | Freq: Every day | ORAL | 3 refills | Status: DC

## 2016-07-02 NOTE — Telephone Encounter (Signed)
Received fax from [facility]  Pts ins will not cover Lialda taking 4tabs per day.  Per Dr. Lanetta Inch last OV pt should be taking 2.4g per day.  [facility] Lialda 2.4g daily #60.

## 2016-07-03 DIAGNOSIS — G35 Multiple sclerosis: Secondary | ICD-10-CM | POA: Diagnosis not present

## 2016-07-03 DIAGNOSIS — M130 Polyarthritis, unspecified: Secondary | ICD-10-CM | POA: Diagnosis not present

## 2016-07-03 DIAGNOSIS — Z9181 History of falling: Secondary | ICD-10-CM | POA: Diagnosis not present

## 2016-07-03 DIAGNOSIS — M25552 Pain in left hip: Secondary | ICD-10-CM | POA: Diagnosis not present

## 2016-07-03 DIAGNOSIS — I872 Venous insufficiency (chronic) (peripheral): Secondary | ICD-10-CM | POA: Diagnosis not present

## 2016-07-04 DIAGNOSIS — N319 Neuromuscular dysfunction of bladder, unspecified: Secondary | ICD-10-CM | POA: Diagnosis not present

## 2016-07-10 DIAGNOSIS — M25552 Pain in left hip: Secondary | ICD-10-CM | POA: Diagnosis not present

## 2016-07-10 DIAGNOSIS — M130 Polyarthritis, unspecified: Secondary | ICD-10-CM | POA: Diagnosis not present

## 2016-07-10 DIAGNOSIS — I872 Venous insufficiency (chronic) (peripheral): Secondary | ICD-10-CM | POA: Diagnosis not present

## 2016-07-10 DIAGNOSIS — G35 Multiple sclerosis: Secondary | ICD-10-CM | POA: Diagnosis not present

## 2016-07-10 DIAGNOSIS — Z9181 History of falling: Secondary | ICD-10-CM | POA: Diagnosis not present

## 2016-07-12 DIAGNOSIS — Z9181 History of falling: Secondary | ICD-10-CM | POA: Diagnosis not present

## 2016-07-12 DIAGNOSIS — G35 Multiple sclerosis: Secondary | ICD-10-CM | POA: Diagnosis not present

## 2016-07-12 DIAGNOSIS — I872 Venous insufficiency (chronic) (peripheral): Secondary | ICD-10-CM | POA: Diagnosis not present

## 2016-07-12 DIAGNOSIS — M130 Polyarthritis, unspecified: Secondary | ICD-10-CM | POA: Diagnosis not present

## 2016-07-12 DIAGNOSIS — M25552 Pain in left hip: Secondary | ICD-10-CM | POA: Diagnosis not present

## 2016-07-16 DIAGNOSIS — M130 Polyarthritis, unspecified: Secondary | ICD-10-CM | POA: Diagnosis not present

## 2016-07-16 DIAGNOSIS — M25552 Pain in left hip: Secondary | ICD-10-CM | POA: Diagnosis not present

## 2016-07-16 DIAGNOSIS — I872 Venous insufficiency (chronic) (peripheral): Secondary | ICD-10-CM | POA: Diagnosis not present

## 2016-07-16 DIAGNOSIS — Z9181 History of falling: Secondary | ICD-10-CM | POA: Diagnosis not present

## 2016-07-16 DIAGNOSIS — G35 Multiple sclerosis: Secondary | ICD-10-CM | POA: Diagnosis not present

## 2016-07-18 DIAGNOSIS — Z9181 History of falling: Secondary | ICD-10-CM | POA: Diagnosis not present

## 2016-07-18 DIAGNOSIS — I872 Venous insufficiency (chronic) (peripheral): Secondary | ICD-10-CM | POA: Diagnosis not present

## 2016-07-18 DIAGNOSIS — M25552 Pain in left hip: Secondary | ICD-10-CM | POA: Diagnosis not present

## 2016-07-18 DIAGNOSIS — G35 Multiple sclerosis: Secondary | ICD-10-CM | POA: Diagnosis not present

## 2016-07-18 DIAGNOSIS — M130 Polyarthritis, unspecified: Secondary | ICD-10-CM | POA: Diagnosis not present

## 2016-07-23 DIAGNOSIS — G35 Multiple sclerosis: Secondary | ICD-10-CM | POA: Diagnosis not present

## 2016-07-23 DIAGNOSIS — Z9181 History of falling: Secondary | ICD-10-CM | POA: Diagnosis not present

## 2016-07-23 DIAGNOSIS — M25552 Pain in left hip: Secondary | ICD-10-CM | POA: Diagnosis not present

## 2016-07-23 DIAGNOSIS — M130 Polyarthritis, unspecified: Secondary | ICD-10-CM | POA: Diagnosis not present

## 2016-07-23 DIAGNOSIS — I872 Venous insufficiency (chronic) (peripheral): Secondary | ICD-10-CM | POA: Diagnosis not present

## 2016-07-25 DIAGNOSIS — G35 Multiple sclerosis: Secondary | ICD-10-CM | POA: Diagnosis not present

## 2016-07-26 DIAGNOSIS — I872 Venous insufficiency (chronic) (peripheral): Secondary | ICD-10-CM | POA: Diagnosis not present

## 2016-07-26 DIAGNOSIS — G35 Multiple sclerosis: Secondary | ICD-10-CM | POA: Diagnosis not present

## 2016-07-26 DIAGNOSIS — M25552 Pain in left hip: Secondary | ICD-10-CM | POA: Diagnosis not present

## 2016-07-26 DIAGNOSIS — Z9181 History of falling: Secondary | ICD-10-CM | POA: Diagnosis not present

## 2016-07-26 DIAGNOSIS — M130 Polyarthritis, unspecified: Secondary | ICD-10-CM | POA: Diagnosis not present

## 2016-07-29 DIAGNOSIS — M25552 Pain in left hip: Secondary | ICD-10-CM | POA: Diagnosis not present

## 2016-07-29 DIAGNOSIS — G35 Multiple sclerosis: Secondary | ICD-10-CM | POA: Diagnosis not present

## 2016-07-29 DIAGNOSIS — M130 Polyarthritis, unspecified: Secondary | ICD-10-CM | POA: Diagnosis not present

## 2016-07-29 DIAGNOSIS — Z9181 History of falling: Secondary | ICD-10-CM | POA: Diagnosis not present

## 2016-07-29 DIAGNOSIS — I872 Venous insufficiency (chronic) (peripheral): Secondary | ICD-10-CM | POA: Diagnosis not present

## 2016-07-31 DIAGNOSIS — I872 Venous insufficiency (chronic) (peripheral): Secondary | ICD-10-CM | POA: Diagnosis not present

## 2016-07-31 DIAGNOSIS — M130 Polyarthritis, unspecified: Secondary | ICD-10-CM | POA: Diagnosis not present

## 2016-07-31 DIAGNOSIS — M25552 Pain in left hip: Secondary | ICD-10-CM | POA: Diagnosis not present

## 2016-07-31 DIAGNOSIS — Z9181 History of falling: Secondary | ICD-10-CM | POA: Diagnosis not present

## 2016-07-31 DIAGNOSIS — G35 Multiple sclerosis: Secondary | ICD-10-CM | POA: Diagnosis not present

## 2016-08-06 DIAGNOSIS — I872 Venous insufficiency (chronic) (peripheral): Secondary | ICD-10-CM | POA: Diagnosis not present

## 2016-08-06 DIAGNOSIS — M25552 Pain in left hip: Secondary | ICD-10-CM | POA: Diagnosis not present

## 2016-08-06 DIAGNOSIS — Z9181 History of falling: Secondary | ICD-10-CM | POA: Diagnosis not present

## 2016-08-06 DIAGNOSIS — M130 Polyarthritis, unspecified: Secondary | ICD-10-CM | POA: Diagnosis not present

## 2016-08-06 DIAGNOSIS — G35 Multiple sclerosis: Secondary | ICD-10-CM | POA: Diagnosis not present

## 2016-08-08 DIAGNOSIS — M130 Polyarthritis, unspecified: Secondary | ICD-10-CM | POA: Diagnosis not present

## 2016-08-08 DIAGNOSIS — I872 Venous insufficiency (chronic) (peripheral): Secondary | ICD-10-CM | POA: Diagnosis not present

## 2016-08-08 DIAGNOSIS — M25552 Pain in left hip: Secondary | ICD-10-CM | POA: Diagnosis not present

## 2016-08-08 DIAGNOSIS — Z9181 History of falling: Secondary | ICD-10-CM | POA: Diagnosis not present

## 2016-08-08 DIAGNOSIS — G35 Multiple sclerosis: Secondary | ICD-10-CM | POA: Diagnosis not present

## 2016-08-13 DIAGNOSIS — Z9181 History of falling: Secondary | ICD-10-CM | POA: Diagnosis not present

## 2016-08-13 DIAGNOSIS — G35 Multiple sclerosis: Secondary | ICD-10-CM | POA: Diagnosis not present

## 2016-08-13 DIAGNOSIS — I872 Venous insufficiency (chronic) (peripheral): Secondary | ICD-10-CM | POA: Diagnosis not present

## 2016-08-13 DIAGNOSIS — M25552 Pain in left hip: Secondary | ICD-10-CM | POA: Diagnosis not present

## 2016-08-13 DIAGNOSIS — M130 Polyarthritis, unspecified: Secondary | ICD-10-CM | POA: Diagnosis not present

## 2016-08-16 DIAGNOSIS — I872 Venous insufficiency (chronic) (peripheral): Secondary | ICD-10-CM | POA: Diagnosis not present

## 2016-08-16 DIAGNOSIS — M25552 Pain in left hip: Secondary | ICD-10-CM | POA: Diagnosis not present

## 2016-08-16 DIAGNOSIS — Z9181 History of falling: Secondary | ICD-10-CM | POA: Diagnosis not present

## 2016-08-16 DIAGNOSIS — M130 Polyarthritis, unspecified: Secondary | ICD-10-CM | POA: Diagnosis not present

## 2016-08-16 DIAGNOSIS — G35 Multiple sclerosis: Secondary | ICD-10-CM | POA: Diagnosis not present

## 2016-08-19 ENCOUNTER — Telehealth: Payer: Self-pay | Admitting: Gastroenterology

## 2016-08-19 NOTE — Telephone Encounter (Signed)
Spoke to patient, she states that still having LLQ pain, it has never resolved. She is still taking Bentyl and Lialda. Denies blood in stool, no fever. She understands that Dr. Adela Lank is out of town until 7/5 and is willing to wait to hear back from him.

## 2016-08-22 NOTE — Telephone Encounter (Signed)
If that is the case then would need to consider alternative medical therapies, which we don't have a lot of options for. I should see her back in clinic to reassess her and discuss other options if you can help coordinate. thanks

## 2016-08-22 NOTE — Telephone Encounter (Signed)
Patient states she had a trigger point injection by her surgeon, she is not interested in having any more of these. She said that besides LLQ pain, she is also having pain more mid-left sided abdominal pain.

## 2016-08-22 NOTE — Telephone Encounter (Signed)
Patient scheduled for first available follow up.

## 2016-08-22 NOTE — Telephone Encounter (Signed)
This patient has had multiple CT scans and an exploratory laporotomy for her chronic LLQ pain, without clear etiology. I think she has abdominal wall pain. She has failed gabapentin, and capcaisin cream. We tried bentyl to see if she had a component of bowel spasm but it appears not to have helped. She is already on wellbutrin and celexa, TCA may not be a good option in this light. I think for her symptoms a trigger point injection for abdominal wall pain may be her best option. We had discussed this during our clinic visit. It is up to her if she wants to proceed with this depending on how much it is bothering her. If she wants to proceed, we can refer her to Dr. Ayesha Mohair of sports medicine. Referral would be for abdominal wall pain / trigger point injection. Thanks

## 2016-08-27 ENCOUNTER — Other Ambulatory Visit: Payer: Self-pay | Admitting: Gastroenterology

## 2016-08-28 NOTE — Telephone Encounter (Signed)
Pt is requesting refill of Bentyl. Are you ok to refill?

## 2016-08-29 NOTE — Telephone Encounter (Signed)
Yes that's fine to refill - thanks

## 2016-10-08 ENCOUNTER — Ambulatory Visit (INDEPENDENT_AMBULATORY_CARE_PROVIDER_SITE_OTHER): Payer: Medicare Other | Admitting: Gastroenterology

## 2016-10-08 ENCOUNTER — Encounter: Payer: Self-pay | Admitting: Gastroenterology

## 2016-10-08 VITALS — BP 120/64 | HR 84 | Ht 64.0 in

## 2016-10-08 DIAGNOSIS — R1310 Dysphagia, unspecified: Secondary | ICD-10-CM | POA: Diagnosis not present

## 2016-10-08 DIAGNOSIS — K219 Gastro-esophageal reflux disease without esophagitis: Secondary | ICD-10-CM | POA: Diagnosis not present

## 2016-10-08 DIAGNOSIS — R109 Unspecified abdominal pain: Secondary | ICD-10-CM

## 2016-10-08 DIAGNOSIS — K5150 Left sided colitis without complications: Secondary | ICD-10-CM

## 2016-10-08 MED ORDER — OMEPRAZOLE 20 MG PO CPDR: 20.0000 mg | DELAYED_RELEASE_CAPSULE | Freq: Every day | ORAL | 3 refills | Status: DC

## 2016-10-08 NOTE — Patient Instructions (Signed)
If you are age 60 or older, your body mass index should be between 23-30. Your There is no height or weight on file to calculate BMI. If this is out of the aforementioned range listed, please consider follow up with your Primary Care Provider.  If you are age 68 or younger, your body mass index should be between 19-25. Your There is no height or weight on file to calculate BMI. If this is out of the aformentioned range listed, please consider follow up with your Primary Care Provider.   We have sent the following medications to your pharmacy for you to pick up at your convenience:  Omeprazole  Discontinue Gabapentin  You may purchase Capsaicin cream to help with abdominal wall pain. This is over the counter.  Please continue Lialda.  You were referred to Dr. Ayesha Mohair in Sports Medicine to help with abdominal wall pain. They will contact you with an appointment date and time.  You have been scheduled for an endoscopy. Please follow written instructions given to you at your visit today. If you use inhalers (even only as needed), please bring them with you on the day of your procedure. Your physician has requested that you go to www.[email or web address].com and enter the access code given to you at your visit today. This web site gives a general overview about your procedure. However, you should still follow specific instructions given to you by our office regarding your preparation for the procedure.  Thank you.

## 2016-10-08 NOTE — Progress Notes (Signed)
HPI :  61 y/o female with a history of MS, left sided UC, chronic abdominal pain here for a follow up visit. She is well known to me for these issues  Left sided UC - diagnosed in 2013, on Lialda 2.4 gm / day, Rowasa PRN. Overall doing really well. She is having anywhere from 1-3 BMs per day. No blood in the stools. She is compliant with Lialda and it is working. No flares of symptoms since her last visit. She is not taking bentyl.   Chronic abdominal pain - extensive workup as outlined below. Chronic LLQ pain, tender to palpation. She is s/p ex-lap for this issue. Thought to have abdominal wall pain. Gabapentin is not helping yet she continues to take it. She has tried capcaisin cream in the past but only applied it a few times. Pain - present in the LLQ. Pain present all the time, worse with bowel movements. She thinks it is relieved with lying down. She is in a wheelchair most of the time during the day, seated. She rates it as 6-7/10. She reports it can fluctuate. She takes buproprion and celexa, has not tried TCA. She is not sure if she has had a trigger point injection in the past.   Dysphagia / GERD - she endorses dysphagia for a few months. She feels it in the mid chest, usually to solids, such as potatoes. Rare nausea, no vomiting. No postprandial abdominal pain. This is the first time she has had smyptoms like this before. She does endorse some reflux / pyrosis which bothers her. Not taking anything for this  Prior workup has included: Negative CT abdomen / pelvis with contrast 07/14/15 CT abdomen / pelvis with contrast 12/19/14 - mild wall thickening of left colon, , left adnexal 1cm lesion (seen by GYN for this) Colonoscopy 01/02/2015 - mild erythema up to 30cm, otherwise normal exam to TI - path shows mild inflammation with "mild architectural distortion Xray pelvis 09/2015 normal Ex-lap negative Flex sig done on 05/07/16 - active UC throughout left colon - chronic active colitis noted  on path, Started on Lialda 4.8 gm / day as well as Rowasa enemas.    Past Medical History:  Diagnosis Date  . Abdominal wall pain   . Hypoglycemia   . Mild chronic ulcerative colitis (HCC)   . Multiple sclerosis (HCC)   . Obesity   . Panic disorder without agoraphobia   . Pure hypercholesterolemia      Past Surgical History:  Procedure Laterality Date  . arthroscopic knee surgery- left    . HYSTEROTOMY    . lower back      Family History  Problem Relation Age of Onset  . Stroke Mother   . Heart disease Father   . Bladder Cancer Father    Social History  Substance Use Topics  . Smoking status: Never Smoker  . Smokeless tobacco: Never Used  . Alcohol use No   Current Outpatient Prescriptions  Medication Sig Dispense Refill  . buPROPion (WELLBUTRIN XL) 300 MG 24 hr tablet Take 300 mg by mouth every morning.  0  . citalopram (CELEXA) 20 MG tablet Take 20 mg by mouth daily.  1  . gabapentin (NEURONTIN) 300 MG capsule Take 1 capsule (300 mg total) by mouth at bedtime. 168 capsule 0  . gabapentin (NEURONTIN) 600 MG tablet Take 1 tablet (600 mg total) by mouth at bedtime. 90 tablet 3  . LORazepam (ATIVAN) 1 MG tablet TAKE 1/2 TO 1 TABLET BY  MOUTH THREE TIMES DAILY AS NEEDED FOR ANXIETY  1  . mesalamine (LIALDA) 1.2 g EC tablet Take 2 tablets (2.4 g total) by mouth daily. 60 tablet 3  . pravastatin (PRAVACHOL) 40 MG tablet Take 80 mg by mouth at bedtime.   0  . zolpidem (AMBIEN) 10 MG tablet Take 10 mg by mouth at bedtime as needed. for sleep  1   Current Facility-Administered Medications  Medication Dose Route Frequency Provider Last Rate Last Dose  . 0.9 %  sodium chloride infusion  500 mL Intravenous Continuous Armbruster, Reeves Forth, MD       Allergies  Allergen Reactions  . Codeine Shortness Of Breath  . Oxycodone Other (See Comments)    confusion     Review of Systems: All systems reviewed and negative except where noted in HPI.   Lab Results  Component Value  Date   WBC 8.4 05/08/2016   HGB 11.5 (L) 05/08/2016   HCT 34.3 (L) 05/08/2016   MCV 81.5 05/08/2016   PLT 428.0 (H) 05/08/2016    No results found for: ALT, AST, GGT, ALKPHOS, BILITOT  Lab Results  Component Value Date   CREATININE 0.80 07/01/2016   BUN 25 (H) 07/01/2016   NA 140 07/01/2016   K 4.2 07/01/2016   CL 104 07/01/2016   CO2 31 07/01/2016     Physical Exam: BP 120/64 (BP Location: Left Arm, Patient Position: Sitting, Cuff Size: Normal)   Pulse 84   Ht 5\' 4"  (1.626 m)   SpO2 98%  Constitutional: Pleasant,female in no acute distress, sitting in wheelchair HEENT: Normocephalic and atraumatic. Conjunctivae are normal. No scleral icterus. Neck supple.  Cardiovascular: Normal rate, regular rhythm.  Pulmonary/chest: Effort normal and breath sounds normal. No wheezing, rales or rhonchi. Abdominal: Soft, nondistended, tenderness to palpation in the LLQ - reproduces symptoms.  There are no masses palpable. Extremities: no edema Lymphadenopathy: No cervical adenopathy noted. Neurological: Alert and oriented to person place and time. Skin: Skin is warm and dry. No rashes noted. Psychiatric: Normal mood and affect. Behavior is normal.   ASSESSMENT AND PLAN: 61 y/o female here for reassessment of the following issues:  Left sided UC - doing really well on Lialda at this point, tolerating it well. She has not symptoms. She has a relatively recent diagnosis which does not warrant surveillance colonoscopy yet. Will continue her present regimen, follow up yearly for her colitis or sooner with changes  Abdominal wall pain - suspect her chronic LLQ pain as described above is related to abdominal wall etiology. Extensive evaluation included imaging and exploratory laparoscopy as above. I discussed this at length with her. Gabapentin has not helped at all, thus we will stop it. She has not had a true trial of capsaicin cream and she wants to try this daily for a few weeks. We have  stopped bentyl. She is not a good candidate for a TCA unless she makes changes to her regimen (celexa, buproprion), she wanted to hold off on that. She was agreeable to trial of a trigger point injection, will refer her to Dr. 46 of sports medicine to see if this helps.   Dysphagia / GERD - ongoing a few months. Discussed ddx and management options. Will start omeprazole 20mg  once daily for reflux. I also offered her an EGD with dilation to further evaluate and treat. Following discussion of risks / benefits she wished to proceed. All questions answered. Further recommendations pending the results.   Ayesha Mohair, MD [facility] Pager [phone]

## 2016-10-17 ENCOUNTER — Ambulatory Visit (AMBULATORY_SURGERY_CENTER): Payer: Medicare Other | Admitting: Gastroenterology

## 2016-10-17 ENCOUNTER — Encounter: Payer: Self-pay | Admitting: Gastroenterology

## 2016-10-17 VITALS — BP 110/63 | HR 75 | Temp 98.4°F | Resp 12 | Ht 64.0 in

## 2016-10-17 DIAGNOSIS — K259 Gastric ulcer, unspecified as acute or chronic, without hemorrhage or perforation: Secondary | ICD-10-CM | POA: Diagnosis not present

## 2016-10-17 DIAGNOSIS — K2950 Unspecified chronic gastritis without bleeding: Secondary | ICD-10-CM | POA: Diagnosis not present

## 2016-10-17 DIAGNOSIS — K229 Disease of esophagus, unspecified: Secondary | ICD-10-CM

## 2016-10-17 DIAGNOSIS — K208 Other esophagitis: Secondary | ICD-10-CM | POA: Diagnosis not present

## 2016-10-17 DIAGNOSIS — K222 Esophageal obstruction: Secondary | ICD-10-CM

## 2016-10-17 DIAGNOSIS — R1310 Dysphagia, unspecified: Secondary | ICD-10-CM

## 2016-10-17 MED ORDER — SODIUM CHLORIDE 0.9 % IV SOLN: 500.0000 mL | INTRAVENOUS | Status: DC

## 2016-10-17 NOTE — Op Note (Signed)
[facility] Patient Name: Deborah Rangel Procedure Date: 10/17/2016 10:32 AM MRN: [id number] Endoscopist: Viviann Spare P. Dimitri Dsouza MD, MD Age: 60 Referring MD:  Date of Birth: 07/22/1955 Gender: Female Account #: [id number] Procedure:                Upper GI endoscopy Indications:              Dysphagia, Heartburn - just recently started                            omeprazole Medicines:                Monitored Anesthesia Care Procedure:                Pre-Anesthesia Assessment:                           - Prior to the procedure, a History and Physical                            was performed, and patient medications and                            allergies were reviewed. The patient's tolerance of                            previous anesthesia was also reviewed. The risks                            and benefits of the procedure and the sedation                            options and risks were discussed with the patient.                            All questions were answered, and informed consent                            was obtained. Prior Anticoagulants: The patient has                            taken no previous anticoagulant or antiplatelet                            agents. ASA Grade Assessment: II - A patient with                            mild systemic disease. After reviewing the risks                            and benefits, the patient was deemed in                            satisfactory condition to undergo the procedure.  After obtaining informed consent, the endoscope was                            passed under direct vision. Throughout the                            procedure, the patient's blood pressure, pulse, and                            oxygen saturations were monitored continuously. The                            Endoscope was introduced through the mouth, and                            advanced to the second part of duodenum. The  upper                            GI endoscopy was accomplished without difficulty.                            The patient tolerated the procedure well. Scope In: Scope Out: Findings:                 Esophagogastric landmarks were identified: the                            Z-line was found at 36 cm, the gastroesophageal                            junction was found at 36 cm and the upper extent of                            the gastric folds was found at 40 cm from the                            incisors.                           A 4 cm hiatal hernia was present.                           The Z-line was irregular with 5-54mm tongue of                            salmon colored mucosa. Biopsies were taken with a                            cold forceps for histology.                           One suspected subtle benign-appearing, intrinsic                            stenosis  was found 36 cm from the incisors. A TTS                            dilator was passed through the scope. Dilation with                            an 18-19-20 mm balloon dilator was performed to 20                            mm. No appreciable mucosal wrents were noted after                            this intervention                           The exam of the esophagus was otherwise normal.                           One non-bleeding superficial gastric ulcer with no                            stigmata of bleeding was found in the gastric                            antrum. The lesion was 2 mm in largest dimension.                           Patchy erythematous mucosa was found in the gastric                            antrum. Biopsies were taken from the antrum,                            incisura, and body with a cold forceps for                            Helicobacter pylori testing.                           The exam of the stomach was otherwise normal.                           The duodenal bulb and second portion  of the                            duodenum were normal. Complications:            No immediate complications. Estimated blood loss:                            Minimal. Estimated Blood Loss:     Estimated blood loss was minimal. Estimated blood  loss was minimal. Impression:               - Esophagogastric landmarks identified.                           - 4 cm hiatal hernia.                           - Z-line irregular. Biopsied.                           - Subtle benign-appearing esophageal stenosis at                            the GEJ. Dilated to 20mm.                           - Non-bleeding gastric ulcer with no stigmata of                            bleeding.                           - Erythematous mucosa in the antrum. Biopsied.                           - Normal duodenal bulb and second portion of the                            duodenum. Recommendation:           - Patient has a contact number available for                            emergencies. The signs and symptoms of potential                            delayed complications were discussed with the                            patient. Return to normal activities tomorrow.                            Written discharge instructions were provided to the                            patient.                           - Resume previous diet.                           - Continue present medications (omeprazole)                           - Await pathology results and course following  dilation. Viviann Spare P. Shaunak Kreis MD, MD 10/17/2016 10:57:45 AM This report has been signed electronically.

## 2016-10-17 NOTE — Progress Notes (Signed)
Called to room to assist during endoscopic procedure.  Patient ID and intended procedure confirmed with present staff. Received instructions for my participation in the procedure from the performing physician.  

## 2016-10-17 NOTE — Patient Instructions (Signed)
YOU HAD AN ENDOSCOPIC PROCEDURE TODAY AT THE [facility]:   Refer to the procedure report that was given to you for any specific questions about what was found during the examination.  If the procedure report does not answer your questions, please call your gastroenterologist to clarify.  If you requested that your care partner not be given the details of your procedure findings, then the procedure report has been included in a sealed envelope for you to review at your convenience later.  YOU SHOULD EXPECT: Some feelings of bloating in the abdomen. Passage of more gas than usual.  Walking can help get rid of the air that was put into your GI tract during the procedure and reduce the bloating. If you had a lower endoscopy (such as a colonoscopy or flexible sigmoidoscopy) you may notice spotting of blood in your stool or on the toilet paper. If you underwent a bowel prep for your procedure, you may not have a normal bowel movement for a few days.  Please Note:  You might notice some irritation and congestion in your nose or some drainage.  This is from the oxygen used during your procedure.  There is no need for concern and it should clear up in a day or so.  SYMPTOMS TO REPORT IMMEDIATELY:   Following upper endoscopy (EGD)  Vomiting of blood or coffee ground material  New chest pain or pain under the shoulder blades  Painful or persistently difficult swallowing  New shortness of breath  Fever of 100F or higher  Black, tarry-looking stools  For urgent or emergent issues, a gastroenterologist can be reached at any hour by calling [phone].   DIET:  Follow Post Esophageal Dilation Diet as ordered.  Drink plenty of fluids but you should avoid alcoholic beverages for 24 hours.  ACTIVITY:  You should plan to take it easy for the rest of today and you should NOT DRIVE or use heavy machinery until tomorrow (because of the sedation medicines used during the test).    FOLLOW UP: Our  staff will call the number listed on your records the next business day following your procedure to check on you and address any questions or concerns that you may have regarding the information given to you following your procedure. If we do not reach you, we will leave a message.  However, if you are feeling well and you are not experiencing any problems, there is no need to return our call.  We will assume that you have returned to your regular daily activities without incident.  If any biopsies were taken you will be contacted by phone or by letter within the next 1-3 weeks.  Please call us at [phone] if you have not heard about the biopsies in 3 weeks.      SIGNATURES/CONFIDENTIALITY: You and/or your care partner have signed paperwork which will be entered into your electronic medical record.  These signatures attest to the fact that that the information above on your After Visit Summary has been reviewed and is understood.  Full responsibility of the confidentiality of this discharge information lies with you and/or your care-partner.

## 2016-10-17 NOTE — Progress Notes (Signed)
To PACU, VSS. Report to [email or web address] 

## 2016-10-18 ENCOUNTER — Telehealth: Payer: Self-pay | Admitting: *Deleted

## 2016-10-18 NOTE — Telephone Encounter (Signed)
  Follow up Call-  Call back number 10/17/2016 05/09/2016  Post procedure Call Back phone  # [phone]  Permission to leave phone message Yes Yes  Some recent data might be hidden     Patient questions:  Do you have a fever, pain , or abdominal swelling? No. Pain Score  0 *  Have you tolerated food without any problems? Yes.    Have you been able to return to your normal activities? Yes.    Do you have any questions about your discharge instructions: Diet   No. Medications  No. Follow up visit  No.  Do you have questions or concerns about your Care? No.  Actions: * If pain score is 4 or above: No action needed, pain <4.

## 2016-10-23 ENCOUNTER — Encounter: Payer: Self-pay | Admitting: Gastroenterology

## 2016-11-02 NOTE — Progress Notes (Signed)
[name] [address] [place], [place] [address] Phone: [phone] Subjective:    I'm seeing this patient by the request  of:    CC: left sided abdominal wall pain  NOB:[id number]  Deborah Rangel is a 61 y.o. female coming in with complaint of mild pain. Past medical history significant for ulcerative colitis. She has been referred from another doctor. She is looking to get an injection in her abdomen. Patient was unable to weigh today. Has MS. Has weakness of lower extremities  Patient is in a wheelchair. Patient has some difficulty with a pain on the left lower side. Points to the groin. Discussed the pain as a dull, throbbing aching sensation. Patient states though that this pain has been unrelenting. Seems to be worse with movement but sometimes can even wake her at night. Does not remember injury.     patient did have an MRI of the lumbar spine 08/29/2015. I have mild bulging at L4-L5 with slight flattening of the ventral thecal sac and mild lateral recess encroachment. independently visualized by myself  Past Medical History:  Diagnosis Date  . Abdominal wall pain   . Hypoglycemia   . Mild chronic ulcerative colitis (HCC)   . Multiple sclerosis (HCC)   . Obesity   . Panic disorder without agoraphobia   . Pure hypercholesterolemia    Past Surgical History:  Procedure Laterality Date  . arthroscopic knee surgery- left    . HYSTEROTOMY    . lower back     . ROTATOR CUFF REPAIR Right 2013   Social History   Social History  . Marital status: Married    Spouse name: N/A  . Number of children: N/A  . Years of education: N/A   Social History Main Topics  . Smoking status: Never Smoker  . Smokeless tobacco: Never Used  . Alcohol use No  . Drug use: No  . Sexual activity: Yes    Partners: Male   Other Topics Concern  . None   Social History Narrative  . None   Allergies  Allergen Reactions  . Codeine Shortness Of Breath  .  Oxycodone Other (See Comments)    confusion   Family History  Problem Relation Age of Onset  . Stroke Mother   . Heart disease Father   . Bladder Cancer Father   . Colon cancer Neg Hx   . Esophageal cancer Neg Hx   . Rectal cancer Neg Hx   . Stomach cancer Neg Hx      Past medical history, social, surgical and family history all reviewed in electronic medical record.  No pertanent information unless stated regarding to the chief complaint.   Review of Systems:Review of systems updated and as accurate as of 11/04/16  No headache, visual changes, nausea, vomiting, diarrhea, constipation, dizziness, abdominal pain, skin rash, fevers, chills, night sweats, weight loss, swollen lymph nodes,chest pain, shortness of breath, mood changes. Positive muscle aches, joint pain, body aches  Objective  Blood pressure 100/60, pulse 91, height 5\' 4"  (1.626 m), weight 185 lb (83.9 kg), SpO2 98 %. Systems examined below as of 11/04/16   General: No apparent distress alert and oriented x3 mood and affect normal, dressed appropriately.  HEENT: Pupils equal, extraocular movements intact  Respiratory: Patient's speak in full sentences and does not appear short of breath  Cardiovascular: No lower extremity edema, non tender, no erythema  Skin: Warm dry intact with no signs of infection or rash on  extremities or on axial skeleton.  Abdomen: Soft nontender No true masses appreciated. Bowel sounds positive in all 4 quadrants. More pain in the right groin area. Neuro: Cranial nerves II through XII are intact, neurovascularly intact in all extremities with 2+ DTRs and 2+ pulses.  Lymph: No lymphadenopathy of posterior or anterior cervical chain or axillae bilaterally.  Gait Patient is in a wheelchair MSK:  Non tender with full range of motion and good stability and symmetric of shoulders, elbows, wrist,  knee and ankles bilaterally. Patient has weakness of the left lower extremity 3 out of 5 strength compared to  4 out of 5 on the contralateral sign. Muscle atrophy noted. Patient's left hip does have considerable decrease in internal range of motion compared to contralateral sign. Patient does have significant tightness of the hamstrings bilaterally left greater than right. Tenderness to palpation and appears palmar musculature of the lumbar spine on the left side. Unable to do straight leg test or Faber test secondary to tightness and pain.   Impression and Recommendations:     This case required medical decision making of moderate complexity.      Note: This dictation was prepared with Dragon dictation along with smaller phrase technology. Any transcriptional errors that result from this process are unintentional.

## 2016-11-04 ENCOUNTER — Encounter: Payer: Self-pay | Admitting: Family Medicine

## 2016-11-04 ENCOUNTER — Ambulatory Visit (INDEPENDENT_AMBULATORY_CARE_PROVIDER_SITE_OTHER): Payer: Medicare Other | Admitting: Family Medicine

## 2016-11-04 DIAGNOSIS — M25552 Pain in left hip: Secondary | ICD-10-CM

## 2016-11-04 MED ORDER — VITAMIN D (ERGOCALCIFEROL) 1.25 MG (50000 UNIT) PO CAPS: 50000.0000 [IU] | ORAL_CAPSULE | ORAL | 0 refills | Status: DC

## 2016-11-04 NOTE — Assessment & Plan Note (Signed)
Patient has significant weakness of the left hip. I think that this is secondary to multiple sclerosis. I believe that this is likely contributing to more pain on the joint. Think the patient's pain also could be possibly more of a lumbar radiculopathy. Patient did not do well with the gabapentin initially. We'll try once weekly vitamin D for muscle strength and endurance, we discussed over-the-counter medications a could be beneficial and could be also helping with patient's multiple sclerosis. We discussed that this could also be the possibility of the weakness in more aggressive treatment for the multiple sclerosis can also be of benefit. I do not believe that this is a gastrointestinal problem and I do not see any signs of a abdominal wall nerve entrapment at this time.

## 2016-11-04 NOTE — Patient Instructions (Signed)
Good to see you  Ice 20 minutes 2 times daily. Usually after activity and before bed. I think it could be coming from back and hip  We will try conservative therapy first  Tylenol 325mg -650mg   Times a day  Once weekly vitamin D for 12 weeks.  Over the counter get  Tart cherry extract 1200mg  at night Turmeric 500mg  daily  See me again in 4 weeks and lets make sure we are making progress.

## 2016-11-13 ENCOUNTER — Encounter: Payer: Self-pay | Admitting: Gastroenterology

## 2016-11-19 NOTE — Telephone Encounter (Signed)
Done

## 2016-12-01 NOTE — Progress Notes (Signed)
[name] [address] [place], [place] [address] Phone: [phone] Subjective:     CC: left sided abdominal wall pain f/u   EXN:[id number]  Deborah Rangel is a 61 y.o. female coming in with complaint of mild pain. Past medical history significant for ulcerative colitis. She has been referred from another doctor. She is looking to get an injection in her abdomen. Patient was unable to weigh today. Has MS. Has weakness of lower extremities  Patient is in a wheelchair. Patient has some difficulty with a pain on the left lower side. Points to the groin. Discussed the pain as a dull, throbbing aching sensation. Patient states though that this pain has been unrelenting. Seems to be worse with movement but sometimes can even wake her at night. Does not remember injury.  Update 12/01/2016-patient was seen one month ago and found to have significant weakness of the left hip. Patient does have a past medical history significant for multiple sclerosis. Differential includes a lumbar radiculopathy. Patient was started on once weekly vitamin D, over-the-counter medications, home exercises and icing regimen. Patient states minimal change. Still having pain      patient did have an MRI of the lumbar spine 08/29/2015. I have mild bulging at L4-L5 with slight flattening of the ventral thecal sac and mild lateral recess encroachment. independently visualized by myself  Past Medical History:  Diagnosis Date  . Abdominal wall pain   . Hypoglycemia   . Mild chronic ulcerative colitis (HCC)   . Multiple sclerosis (HCC)   . Obesity   . Panic disorder without agoraphobia   . Pure hypercholesterolemia    Past Surgical History:  Procedure Laterality Date  . arthroscopic knee surgery- left    . HYSTEROTOMY    . lower back     . ROTATOR CUFF REPAIR Right 2013   Social History   Social History  . Marital status: Married    Spouse name: N/A  . Number of children: N/A  .  Years of education: N/A   Social History Main Topics  . Smoking status: Never Smoker  . Smokeless tobacco: Never Used  . Alcohol use No  . Drug use: No  . Sexual activity: Yes    Partners: Male   Other Topics Concern  . None   Social History Narrative  . None   Allergies  Allergen Reactions  . Codeine Shortness Of Breath  . Oxycodone Other (See Comments)    confusion   Family History  Problem Relation Age of Onset  . Stroke Mother   . Heart disease Father   . Bladder Cancer Father   . Colon cancer Neg Hx   . Esophageal cancer Neg Hx   . Rectal cancer Neg Hx   . Stomach cancer Neg Hx      Past medical history, social, surgical and family history all reviewed in electronic medical record.  No pertanent information unless stated regarding to the chief complaint.   Review of Systems:Review of systems updated and as accurate as of 12/02/16  No headache, visual changes, nausea, vomiting, diarrhea, constipation, dizziness, abdominal pain, skin rash, fevers, chills, night sweats, weight loss, swollen lymph nodes,chest pain, shortness of breath, mood changes. Positive muscle aches, joint pain, body aches   Objective  Blood pressure 102/60, pulse 86, height 5\' 4"  (1.626 m), weight 185 lb (83.9 kg), SpO2 98 %. Systems examined below as of 12/02/16   General: No apparent distress alert and oriented x3 mood  and affect normal, dressed appropriately.  HEENT: Pupils equal, extraocular movements intact  Respiratory: Patient's speak in full sentences and does not appear short of breath  Cardiovascular: No lower extremity edema, non tender, no erythema  Skin: Warm dry intact with no signs of infection or rash on extremities or on axial skeleton.  Abdomen: Soft nontender No true masses appreciated. Bowel sounds positive in all 4 quadrants. More pain in the right groin area. Neuro: Cranial nerves II through XII are intact, neurovascularly intact in all extremities with 2+ DTRs and 2+  pulses.  Lymph: No lymphadenopathy of posterior or anterior cervical chain or axillae bilaterally.  Gait Patient is in a wheelchair MSK:  Non tender with full range of motion and good stability and symmetric of shoulders, elbows, wrist,  knee and ankles bilaterally. Patient has weakness of the left lower extremity 3 out of 5 strength compared to 4 out of 5 on the contralateral sign. Muscle atrophy noted. Patient's left hip does have considerable decrease in internal range of motion compared to contralateral sign. Patient does have significant tightness of the hamstrings bilaterally left greater than right. Tenderness to palpation and appears paraspinalmusculature of the lumbar spine on the left side. Unable to do straight leg test or Faber test secondary to tightness and pain.Same as previous exam.  Worse pain with internal range of motion   Procedure: Real-time Ultrasound Guided Injection of left intra-articular hip Device: GE Logiq Q7  Ultrasound guided injection is preferred based studies that show increased duration, increased effect, greater accuracy, decreased procedural pain, increased response rate with ultrasound guided versus blind injection.  Verbal informed consent obtained.  Time-out conducted.  Noted no overlying erythema, induration, or other signs of local infection.  Skin prepped in a sterile fashion.  Local anesthesia: Topical Ethyl chloride.  With sterile technique and under real time ultrasound guidance:  Anterior capsule visualized, needle visualized going to the head neck junction at the anterior capsule. Pictures taken. Patient did have injection of 3 cc of 0.5% Marcaine, and 1 cc of Kenalog 40 mg/dL. Completed without difficulty  Pain immediately resolved suggesting accurate placement of the medication.  Advised to call if fevers/chills, erythema, induration, drainage, or persistent bleeding.  Images permanently stored and available for review in the ultrasound unit.    Impression: Technically successful ultrasound guided injection.   Impression and Recommendations:     This case required medical decision making of moderate complexity.      Note: This dictation was prepared with Dragon dictation along with smaller phrase technology. Any transcriptional errors that result from this process are unintentional.

## 2016-12-02 ENCOUNTER — Ambulatory Visit (INDEPENDENT_AMBULATORY_CARE_PROVIDER_SITE_OTHER): Payer: Medicare Other | Admitting: Family Medicine

## 2016-12-02 ENCOUNTER — Ambulatory Visit: Payer: Self-pay

## 2016-12-02 ENCOUNTER — Encounter: Payer: Self-pay | Admitting: Family Medicine

## 2016-12-02 VITALS — BP 102/60 | HR 86 | Ht 64.0 in | Wt 185.0 lb

## 2016-12-02 DIAGNOSIS — G35 Multiple sclerosis: Secondary | ICD-10-CM | POA: Diagnosis not present

## 2016-12-02 DIAGNOSIS — M25552 Pain in left hip: Secondary | ICD-10-CM

## 2016-12-02 NOTE — Assessment & Plan Note (Signed)
Patient is an injection today. Tolerated the procedure well. Hopefully that this will be beneficial. Patient does have significant weakness on that side will make it difficult to assess for sure. Patient has had an MRI of the back previously that did show a nerve root impingement. Worsening symptoms we'll consider epidurals in the back. Patient as well as husband questions were answered today. Patient will continue the over-the-counter medications. Follow-up again in 2-3 weeks

## 2016-12-02 NOTE — Patient Instructions (Addendum)
Good to see you  We tried an injection in your hip today  I am hoping for a lot of improvement.  If in 1 week no better please send a message and I would like to have you try a epidural in your back.  IF we decide to do an epidural in your back then I would like to see you again 2 weeks after the injection;.

## 2016-12-05 ENCOUNTER — Other Ambulatory Visit: Payer: Self-pay

## 2016-12-05 MED ORDER — OMEPRAZOLE 20 MG PO CPDR: 20.0000 mg | DELAYED_RELEASE_CAPSULE | Freq: Every day | ORAL | 1 refills | Status: DC

## 2016-12-09 ENCOUNTER — Telehealth: Payer: Self-pay | Admitting: Pediatrics

## 2016-12-09 DIAGNOSIS — M5416 Radiculopathy, lumbar region: Secondary | ICD-10-CM

## 2016-12-09 NOTE — Telephone Encounter (Signed)
Patient states injections have not worked.  States she is still having pain.  Please follow up with patient in regard.

## 2016-12-10 NOTE — Telephone Encounter (Signed)
Lets do epidural at L4/5

## 2016-12-10 NOTE — Telephone Encounter (Signed)
lmovm for pt to return call.  

## 2016-12-10 NOTE — Telephone Encounter (Signed)
Discussed with pt. Sent order to [facility].

## 2016-12-16 ENCOUNTER — Ambulatory Visit (INDEPENDENT_AMBULATORY_CARE_PROVIDER_SITE_OTHER): Payer: Medicare Other | Admitting: Family Medicine

## 2016-12-16 ENCOUNTER — Encounter: Payer: Self-pay | Admitting: Family Medicine

## 2016-12-16 VITALS — BP 120/60 | HR 85

## 2016-12-16 DIAGNOSIS — E785 Hyperlipidemia, unspecified: Secondary | ICD-10-CM | POA: Diagnosis not present

## 2016-12-16 DIAGNOSIS — Z0001 Encounter for general adult medical examination with abnormal findings: Secondary | ICD-10-CM | POA: Diagnosis not present

## 2016-12-16 LAB — CBC
HCT: 38.4 % (ref 36.0–46.0)
HEMOGLOBIN: 12.5 g/dL (ref 12.0–15.0)
MCHC: 32.5 g/dL (ref 30.0–36.0)
MCV: 85.3 fl (ref 78.0–100.0)
PLATELETS: 164 10*3/uL (ref 150.0–400.0)
RBC: 4.5 Mil/uL (ref 3.87–5.11)
RDW: 13.1 % (ref 11.5–15.5)
WBC: 5.4 10*3/uL (ref 4.0–10.5)

## 2016-12-16 LAB — COMPREHENSIVE METABOLIC PANEL
ALK PHOS: 89 U/L (ref 39–117)
ALT: 11 U/L (ref 0–35)
AST: 13 U/L (ref 0–37)
Albumin: 3.9 g/dL (ref 3.5–5.2)
BILIRUBIN TOTAL: 0.4 mg/dL (ref 0.2–1.2)
BUN: 31 mg/dL — ABNORMAL HIGH (ref 6–23)
CALCIUM: 9.5 mg/dL (ref 8.4–10.5)
CO2: 29 mEq/L (ref 19–32)
Chloride: 105 mEq/L (ref 96–112)
Creatinine, Ser: 0.71 mg/dL (ref 0.40–1.20)
GFR: 89.25 mL/min (ref >60.00)
Glucose, Bld: 72 mg/dL (ref 70–99)
Potassium: 3.8 mEq/L (ref 3.5–5.1)
Sodium: 141 mEq/L (ref 135–145)
TOTAL PROTEIN: 6 g/dL (ref 6.0–8.3)

## 2016-12-16 LAB — LIPID PANEL
CHOLESTEROL: 238 mg/dL — AB (ref 0–200)
HDL: 53.7 mg/dL (ref >39.00)
LDL Cholesterol: 164 mg/dL — ABNORMAL HIGH (ref 0–99)
NONHDL: 184.05
TRIGLYCERIDES: 102 mg/dL (ref 0.0–149.0)
Total CHOL/HDL Ratio: 4
VLDL: 20.4 mg/dL (ref 0.0–40.0)

## 2016-12-16 NOTE — Progress Notes (Signed)
Subjective:  Deborah Rangel is a 61 y.o. female who presents today for her annual comprehensive physical exam.    HPI:  Deborah Rangel has no acute complaints today.   Lifestyle Diet: Less fatty foods. More lean protein. Plenty of fruits and vegetables. Exercise: No much given  Depression screen [facility] 2/9 12/16/2016  Decreased Interest 3  Down, Depressed, Hopeless 3  PHQ - 2 Score 6  Altered sleeping 3  Tired, decreased energy 3  Change in appetite 0  Feeling bad or failure about yourself  1  Trouble concentrating 0  Moving slowly or fidgety/restless 0  Suicidal thoughts 0  PHQ-9 Score 13  Difficult doing work/chores Somewhat difficult    Health Maintenance Due  Topic Date Due  . Hepatitis C Screening  23-Apr-1955  . HIV Screening  12/22/1971  . TETANUS/TDAP  12/22/1975  . MAMMOGRAM  12/22/2006    ROS: A 14 point review of systems was performed and was negative  PMH:  The following were reviewed and entered/updated in epic: Past Medical History:  Diagnosis Date  . Abdominal wall pain   . Hypoglycemia   . Mild chronic ulcerative colitis (HCC)   . Multiple sclerosis (HCC)   . Obesity   . Panic disorder without agoraphobia   . Pure hypercholesterolemia    Patient Active Problem List   Diagnosis Date Noted  . Dyslipidemia 12/16/2016  . Left hip pain 11/04/2016  . Abdominal wall pain in left lower quadrant 05/01/2016  . Left sided colitis (HCC) 05/01/2016  . 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 Generalized anxiety disorder 06/01/2014  . Neurogenic bladder 06/01/2014   Past Surgical History:  Procedure Laterality Date  . arthroscopic knee surgery- left    . HYSTEROTOMY    . lower back     . ROTATOR CUFF REPAIR Right 2013  . TONSILLECTOMY AND ADENOIDECTOMY      Family History  Problem Relation Age of Onset  . Stroke Mother   . Heart disease Father   . Bladder Cancer Father   . Colon cancer Neg Hx   . Esophageal cancer Neg Hx     . Rectal cancer Neg Hx   . Stomach cancer Neg Hx     Medications- reviewed and updated Current Outpatient Prescriptions  Medication Sig Dispense Refill  . buPROPion (WELLBUTRIN XL) 300 MG 24 hr tablet Take 300 mg by mouth every morning.  0  . citalopram (CELEXA) 20 MG tablet Take 20 mg by mouth daily.  1  . LORazepam (ATIVAN) 1 MG tablet TAKE 1/2 TO 4 TABLET BY MOUTH THREE TIMES DAILY AS NEEDED FOR ANXIETY  1  . mesalamine (LIALDA) 1.2 g EC tablet Take 2 tablets (2.4 g total) by mouth daily. 60 tablet 3  . omeprazole (PRILOSEC) 20 MG capsule Take 1 capsule (20 mg total) by mouth daily. 90 capsule 1  . Vitamin D, Ergocalciferol, (DRISDOL) 50000 units CAPS capsule Take 1 capsule (50,000 Units total) by mouth every 7 (seven) days. 12 capsule 0  . zolpidem (AMBIEN) 10 MG tablet Take 10 mg by mouth at bedtime as needed. for sleep  1  . pravastatin (PRAVACHOL) 80 MG tablet Take 80 mg by mouth at bedtime.  0   No current facility-administered medications for this visit.    Allergies-reviewed and updated Allergies  Allergen Reactions  . Codeine Shortness Of Breath  . Oxycodone Other (See Comments)    confusion    Social History   Social History  .  Marital status: Married    Spouse name: N/A  . Number of children: 2  . Years of education: N/A   Occupational History  . Disability     Social History Main Topics  . Smoking status: Never Smoker  . Smokeless tobacco: Never Used  . Alcohol use No  . Drug use: No  . Sexual activity: Yes    Partners: Male   Other Topics Concern  . None   Social History Narrative  . None   Objective:  Physical Exam: BP 120/60   Pulse 85   There is no height or weight on file to calculate BMI. Gen: NAD, resting comfortably HEENT: TMs normal bilaterally. OP clear. No thyromegaly noted.  CV: RRR with no murmurs appreciated Pulm: NWOB, CTAB with no crackles, wheezes, or rhonchi GI: Normal bowel sounds present. Soft, Nontender,  Nondistended. MSK: no edema, cyanosis, or clubbing noted Skin: warm, dry Neuro: Wheelchair bound due to lower extremity weakness secondary to multiple sclerosis..  Normal strength in upper extremities.  Cranial nerves II through XII intact. Psych: Normal affect and thought content  Assessment/Plan:  Dyslipidemia Check lipid panel today.  Patient has been off statin for several months.  Likely need to restart pending results of lipid panel.  Preventative Healthcare: Flu shot deferred.  Will be getting mammogram later this week.  Check lipid panel.  Obtain prior records.  Patient Counseling:  -Nutrition: Stressed importance of moderation in sodium/caffeine intake, saturated fat and cholesterol, caloric balance, sufficient intake of fresh fruits, vegetables, and fiber.   -Substance Abuse: Discussed cessation/primary prevention of tobacco, alcohol, or other drug use; driving or other dangerous activities under the influence; availability of treatment for abuse.   -Injury prevention: Discussed safety belts, safety helmets, smoke detector, smoking near bedding or upholstery.   -Health maintenance and immunizations reviewed. Please refer to Health maintenance section.  Return to care in 1 year for next preventative visit.   Katina Degree. Jimmey Ralph, MD 12/16/2016 9:54 AM

## 2016-12-16 NOTE — Patient Instructions (Signed)
We will check blood work today.   Preventive Care 40-64 Years, Female Preventive care refers to lifestyle choices and visits with your health care provider that can promote health and wellness. What does preventive care include?  A yearly physical exam. This is also called an annual well check.  Dental exams once or twice a year.  Routine eye exams. Ask your health care provider how often you should have your eyes checked.  Personal lifestyle choices, including: ? Daily care of your teeth and gums. ? Regular physical activity. ? Eating a healthy diet. ? Avoiding tobacco and drug use. ? Limiting alcohol use. ? Practicing safe sex. ? Taking low-dose aspirin daily starting at age 39. ? Taking vitamin and mineral supplements as recommended by your health care provider. What happens during an annual well check? The services and screenings done by your health care provider during your annual well check will depend on your age, overall health, lifestyle risk factors, and family history of disease. Counseling Your health care provider may ask you questions about your:  Alcohol use.  Tobacco use.  Drug use.  Emotional well-being.  Home and relationship well-being.  Sexual activity.  Eating habits.  Work and work Statistician.  Method of birth control.  Menstrual cycle.  Pregnancy history.  Screening You may have the following tests or measurements:  Height, weight, and BMI.  Blood pressure.  Lipid and cholesterol levels. These may be checked every 5 years, or more frequently if you are over 81 years old.  Skin check.  Lung cancer screening. You may have this screening every year starting at age 59 if you have a 30-pack-year history of smoking and currently smoke or have quit within the past 15 years.  Fecal occult blood test (FOBT) of the stool. You may have this test every year starting at age 31.  Flexible sigmoidoscopy or colonoscopy. You may have a  sigmoidoscopy every 5 years or a colonoscopy every 10 years starting at age 56.  Hepatitis C blood test.  Hepatitis B blood test.  Sexually transmitted disease (STD) testing.  Diabetes screening. This is done by checking your blood sugar (glucose) after you have not eaten for a while (fasting). You may have this done every 1-3 years.  Mammogram. This may be done every 1-2 years. Talk to your health care provider about when you should start having regular mammograms. This may depend on whether you have a family history of breast cancer.  BRCA-related cancer screening. This may be done if you have a family history of breast, ovarian, tubal, or peritoneal cancers.  Pelvic exam and Pap test. This may be done every 3 years starting at age 28. Starting at age 51, this may be done every 5 years if you have a Pap test in combination with an HPV test.  Bone density scan. This is done to screen for osteoporosis. You may have this scan if you are at high risk for osteoporosis.  Discuss your test results, treatment options, and if necessary, the need for more tests with your health care provider. Vaccines Your health care provider may recommend certain vaccines, such as:  Influenza vaccine. This is recommended every year.  Tetanus, diphtheria, and acellular pertussis (Tdap, Td) vaccine. You may need a Td booster every 10 years.  Varicella vaccine. You may need this if you have not been vaccinated.  Zoster vaccine. You may need this after age 65.  Measles, mumps, and rubella (MMR) vaccine. You may need at least one  dose of MMR if you were born in 1957 or later. You may also need a second dose.  Pneumococcal 13-valent conjugate (PCV13) vaccine. You may need this if you have certain conditions and were not previously vaccinated.  Pneumococcal polysaccharide (PPSV23) vaccine. You may need one or two doses if you smoke cigarettes or if you have certain conditions.  Meningococcal vaccine. You may  need this if you have certain conditions.  Hepatitis A vaccine. You may need this if you have certain conditions or if you travel or work in places where you may be exposed to hepatitis A.  Hepatitis B vaccine. You may need this if you have certain conditions or if you travel or work in places where you may be exposed to hepatitis B.  Haemophilus influenzae type b (Hib) vaccine. You may need this if you have certain conditions.  Talk to your health care provider about which screenings and vaccines you need and how often you need them. This information is not intended to replace advice given to you by your health care provider. Make sure you discuss any questions you have with your health care provider. Document Released: 03/03/2015 Document Revised: 10/25/2015 Document Reviewed: 12/06/2014 Elsevier Interactive Patient Education  2017 [employer].

## 2016-12-16 NOTE — Assessment & Plan Note (Signed)
Check lipid panel today.  Patient has been off statin for several months.  Likely need to restart pending results of lipid panel.

## 2016-12-19 ENCOUNTER — Telehealth: Payer: Self-pay | Admitting: Family Medicine

## 2016-12-19 IMAGING — MR MR HEAD WO/W CM
10 of 21 series · 22 of 48 positions shown · IV contrast (multihance)
Comparison: No previous brain imaging in this system.

CLINICAL DATA: Personal history of multiple sclerosis. Secondary
progressive multiple sclerosis.

EXAM:
MRI HEAD WITHOUT AND WITH CONTRAST
TECHNIQUE: Multiplanar, multiecho pulse sequences of the brain and surrounding
structures were obtained without and with intravenous contrast.
CONTRAST:  20mL MULTIHANCE GADOBENATE DIMEGLUMINE 529 MG/ML IV SOLN

[Series 2: FLAIR · sagittal · 5.0mm · 0.47mm/px · 2 of 23 slices shown (1 of 2)]
[im 1/23]
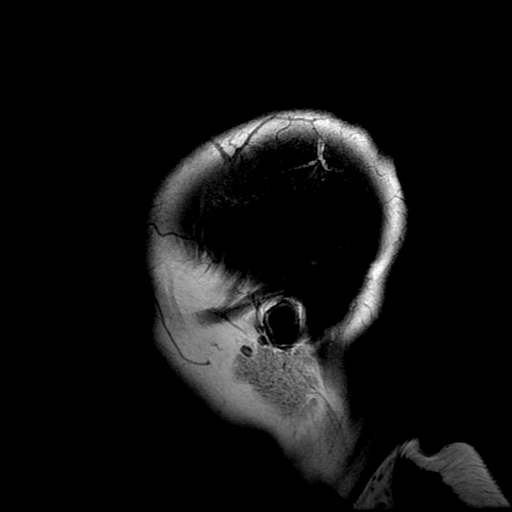
[im 23/23]
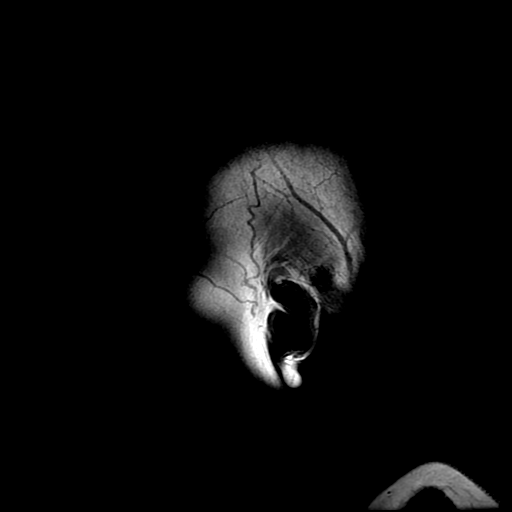

[Series 4: FLAIR · sagittal · 1.6mm · 0.49mm/px · 9 of 232 slices shown (2 of 2)]
[im 1/232]
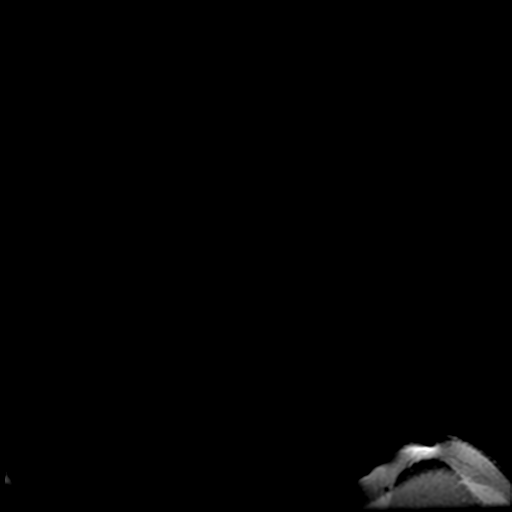
[im 29/232]
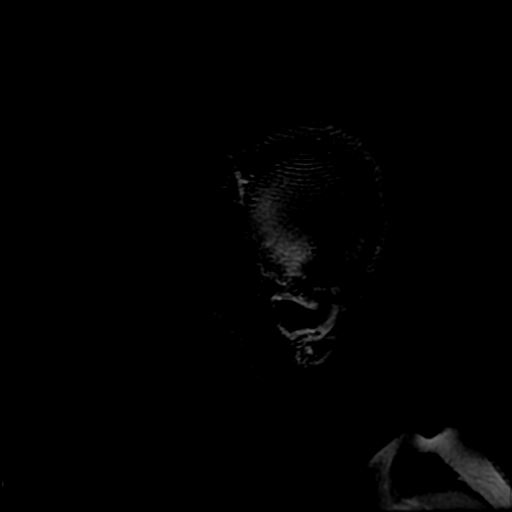
[im 58/232]
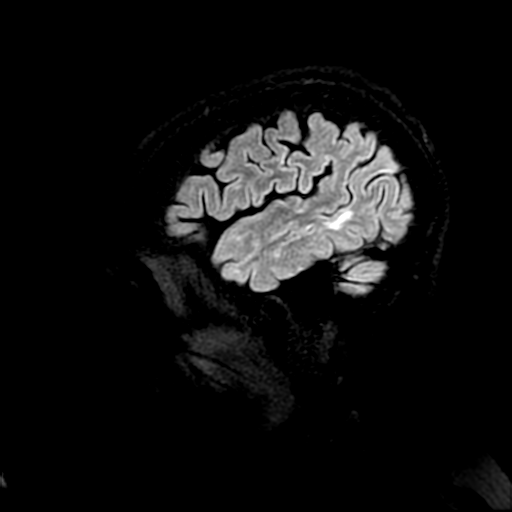
[im 87/232]
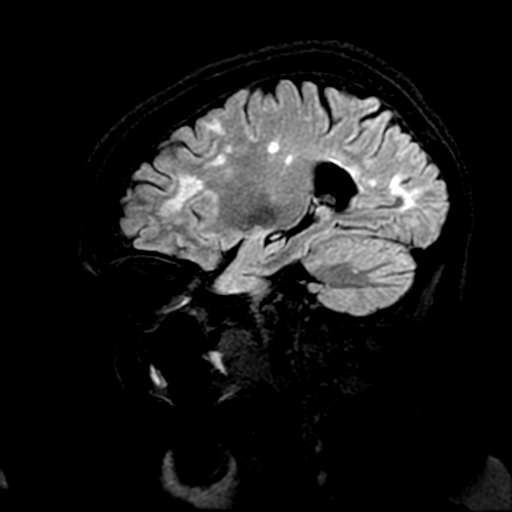
[im 116/232]
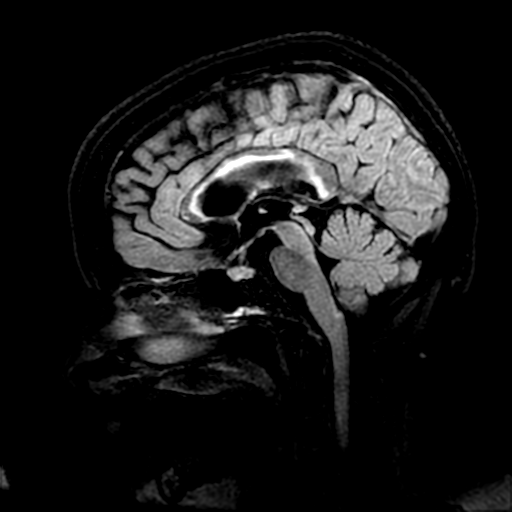
[im 145/232]
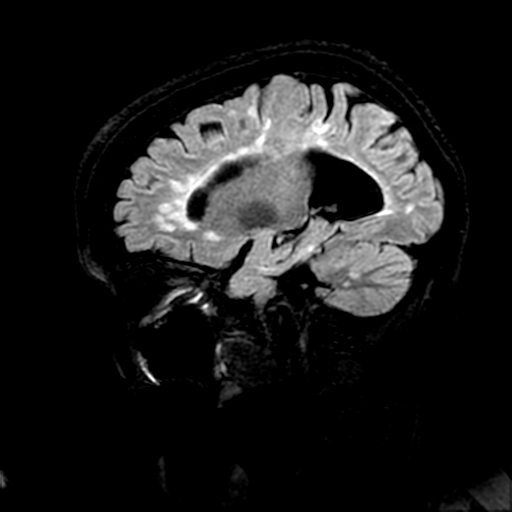
[im 174/232]
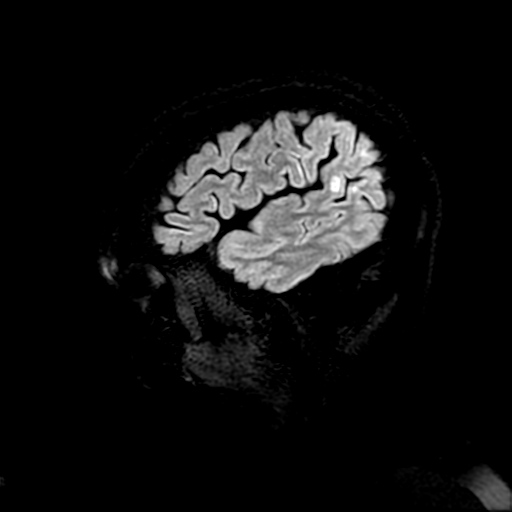
[im 203/232]
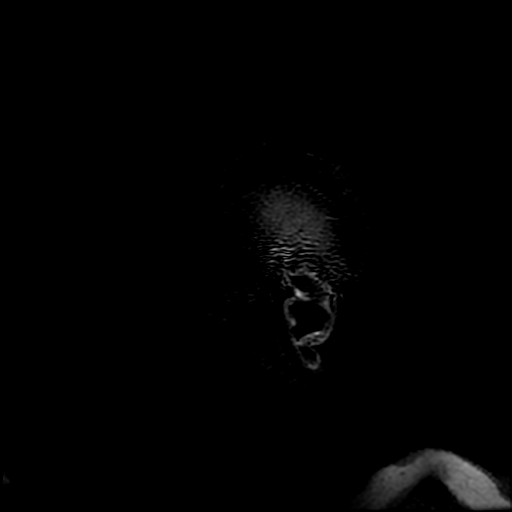
[im 232/232]
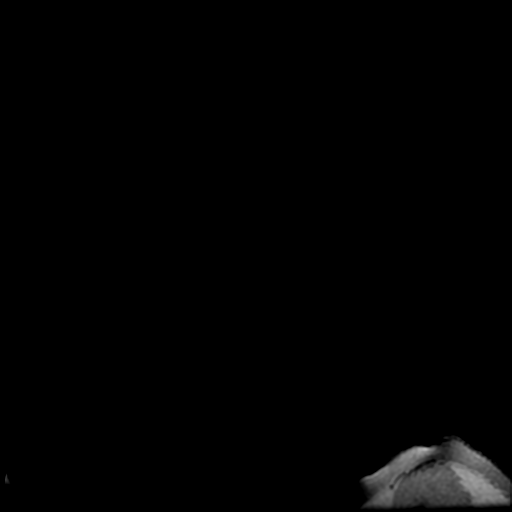

[Series 5: DWI · axial · 5.0mm · 1.02mm/px · z∈[-114,+49]mm · 3 of 68 slices shown (1 of 2)]
[im 1/68]
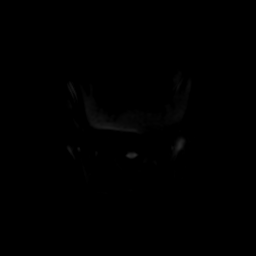
[im 34/68]
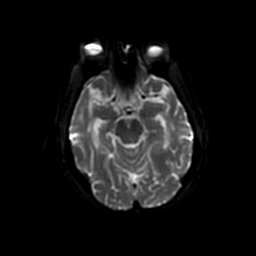
[im 68/68]
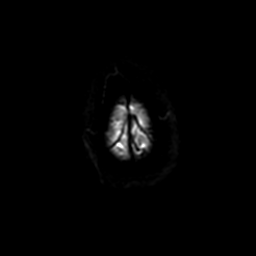

[Series 6: T2 · axial · 5.0mm · 0.47mm/px · 1 of 27 slices shown (1 of 5)]
[im 1/27]
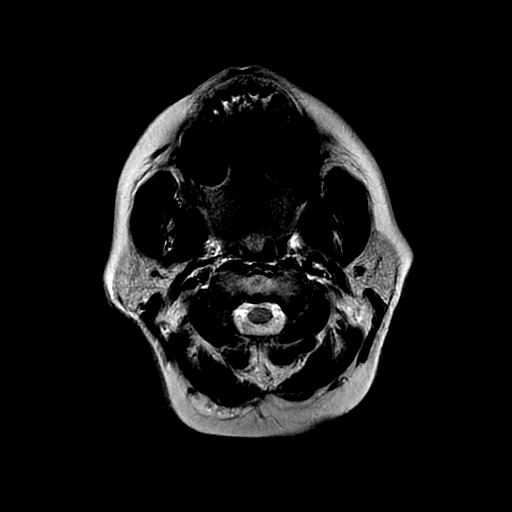

[Series 7: T2 · axial · 5.0mm · 0.47mm/px · 1 of 27 slices shown (2 of 5)]
[im 1/27]
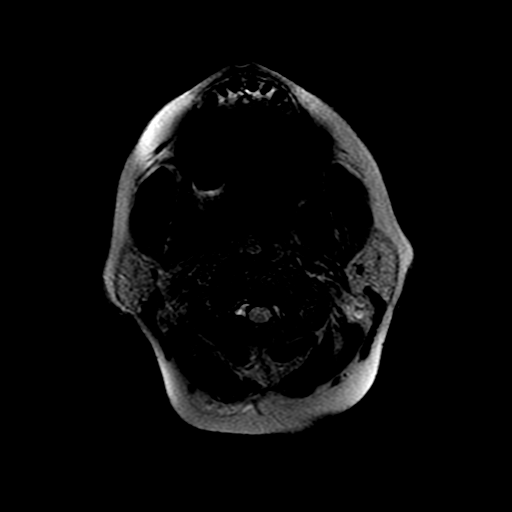

[Series 11: T2 · sagittal · 3.0mm · 0.39mm/px · 1 of 14 slices shown (3 of 5)]
[im 1/14]
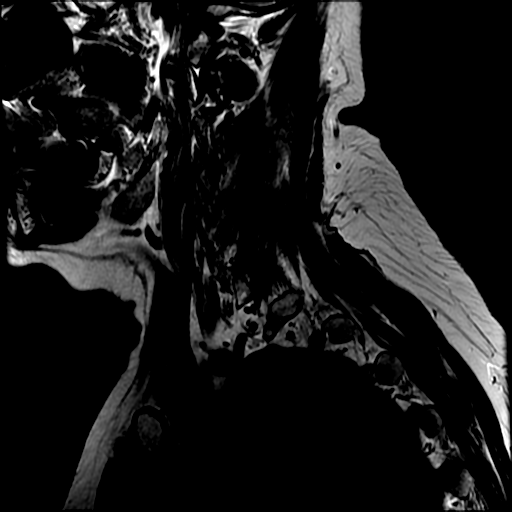

[Series 15: T2 · axial · 3.0mm · 0.35mm/px · z∈[-152,-36]mm · 2 of 42 slices shown (4 of 5)]
[im 1/42]
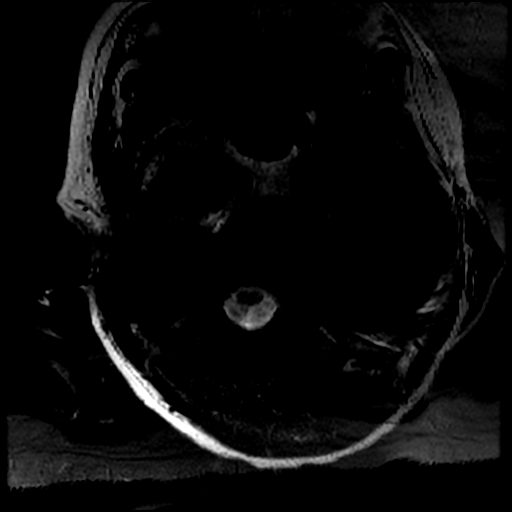
[im 42/42]
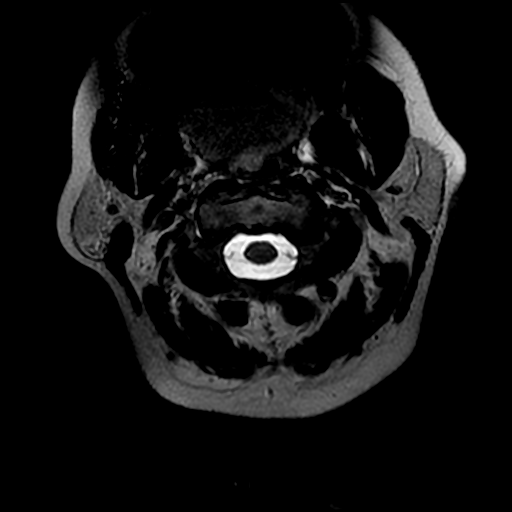

[Series 19: T2 · axial · 3.0mm · 0.35mm/px · 1 of 35 slices shown (5 of 5)]
[im 1/35]
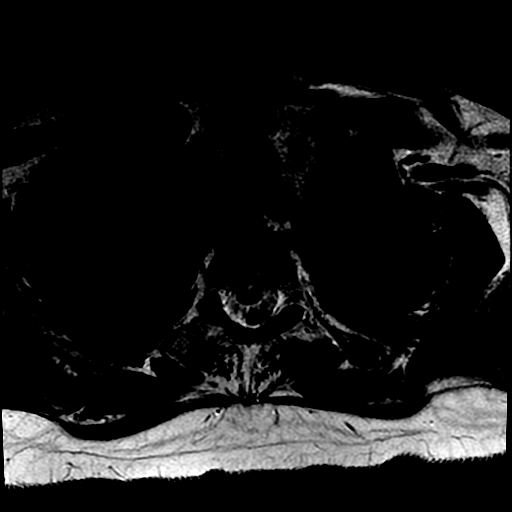

[Series 22: T1 post-contrast · axial · 3.0mm · 0.35mm/px · 1 of 35 slices shown]
[im 1/35]
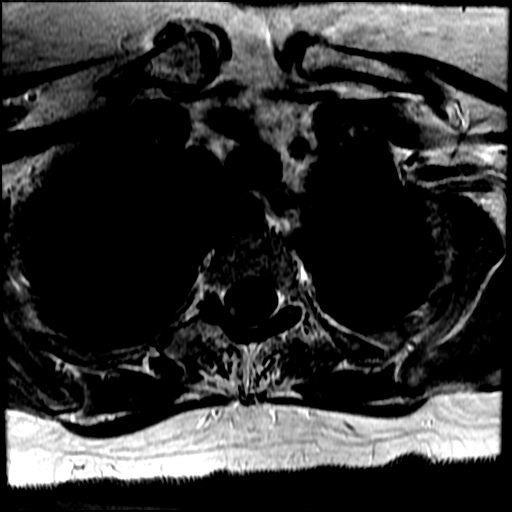

[Series 500: DWI · axial · 5.0mm · 1.02mm/px · 1 of 34 slices shown (2 of 2)]
[im 1/34]
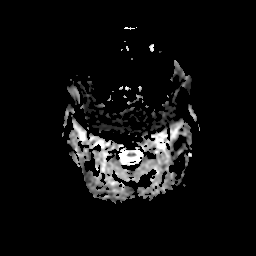

[22 of 48 positions shown; findings below may reference images not displayed]

FINDINGS: Diffusion imaging does not show any acute or subacute infarction or
other cause of restricted diffusion. T2 lesions are present in the
right dorsal medulla, the cerebellar peduncle is, the right
thalamus, the splenium of the corpus callosum and throughout the
cerebral hemispheric deep and subcortical white matter, particularly
prominent in the periventricular regions. The pattern and
distribution is typical of multiple sclerosis. There is some central
volume loss with ventricular prominence right more than left but no
suspicion of obstructive hydrocephalus. No sign of neoplastic mass
lesion, hemorrhage or extra-axial collection. No pituitary mass. No
inflammatory sinus disease. No skull or skullbase lesion.

After contrast administration, none of the white matter lesions
enhance.
IMPRESSION: T2 lesions throughout the brain as outlined above, most severe
within the cerebral hemispheric white matter, pattern and
distribution typical of advanced chronic multiple sclerosis. None of
the lesions show restricted diffusion or contrast enhancement.

## 2016-12-19 NOTE — Telephone Encounter (Signed)
Returning missed call  RE labs. ° °Ty, ° °-LL °

## 2016-12-20 ENCOUNTER — Other Ambulatory Visit: Payer: Self-pay

## 2016-12-20 DIAGNOSIS — Z1231 Encounter for screening mammogram for malignant neoplasm of breast: Secondary | ICD-10-CM | POA: Diagnosis not present

## 2016-12-20 LAB — HM MAMMOGRAPHY

## 2016-12-20 MED ORDER — PRAVASTATIN SODIUM 40 MG PO TABS
40.0000 mg | ORAL_TABLET | Freq: Every day | ORAL | 3 refills | Status: AC
Start: 2016-12-20 — End: ?

## 2016-12-20 NOTE — Telephone Encounter (Signed)
Calling about lab results.  Ty,  -LL

## 2016-12-20 NOTE — Telephone Encounter (Signed)
Pt is aware of results. 

## 2016-12-20 NOTE — Telephone Encounter (Signed)
Patient returning call regarding labs. Please advise. OK to leave detailed message.

## 2016-12-20 NOTE — Telephone Encounter (Signed)
Patient calling back RE labs.  Ty,  -LL

## 2016-12-23 ENCOUNTER — Ambulatory Visit
Admission: RE | Admit: 2016-12-23 | Discharge: 2016-12-23 | Disposition: A | Discharge status: Home / Self Care | Payer: Medicare Other | Source: Ambulatory Visit | Attending: Family Medicine | Admitting: Family Medicine

## 2016-12-23 DIAGNOSIS — M5126 Other intervertebral disc displacement, lumbar region: Secondary | ICD-10-CM | POA: Diagnosis not present

## 2016-12-23 DIAGNOSIS — M5416 Radiculopathy, lumbar region: Secondary | ICD-10-CM

## 2016-12-23 MED ORDER — IOPAMIDOL (ISOVUE-M 200) INJECTION 41%
1.0000 mL | Freq: Once | INTRAMUSCULAR | Status: AC
  Administered 2016-12-23: 1 mL via EPIDURAL

## 2016-12-23 MED ORDER — METHYLPREDNISOLONE ACETATE 40 MG/ML INJ SUSP (RADIOLOG
120.0000 mg | Freq: Once | INTRAMUSCULAR | Status: AC
  Administered 2016-12-23: 120 mg via EPIDURAL

## 2016-12-23 NOTE — Discharge Instructions (Signed)

## 2017-01-02 ENCOUNTER — Encounter: Payer: Self-pay | Admitting: *Deleted

## 2017-01-15 ENCOUNTER — Telehealth: Payer: Self-pay | Admitting: Neurology

## 2017-01-15 NOTE — Telephone Encounter (Signed)
Patient needs a RX faxed to [phone] to get her hand controls in her Deborah Rangel

## 2017-01-16 ENCOUNTER — Encounter: Payer: Self-pay | Admitting: Gastroenterology

## 2017-01-16 ENCOUNTER — Ambulatory Visit: Payer: Medicare Other | Admitting: Gastroenterology

## 2017-01-16 ENCOUNTER — Telehealth: Payer: Self-pay

## 2017-01-16 VITALS — BP 110/60 | HR 91 | Ht 64.0 in | Wt 185.0 lb

## 2017-01-16 DIAGNOSIS — K5150 Left sided colitis without complications: Secondary | ICD-10-CM

## 2017-01-16 DIAGNOSIS — R1032 Left lower quadrant pain: Secondary | ICD-10-CM | POA: Diagnosis not present

## 2017-01-16 DIAGNOSIS — R1011 Right upper quadrant pain: Secondary | ICD-10-CM | POA: Diagnosis not present

## 2017-01-16 DIAGNOSIS — R1310 Dysphagia, unspecified: Secondary | ICD-10-CM

## 2017-01-16 DIAGNOSIS — K259 Gastric ulcer, unspecified as acute or chronic, without hemorrhage or perforation: Secondary | ICD-10-CM | POA: Diagnosis not present

## 2017-01-16 MED ORDER — DULOXETINE HCL 60 MG PO CPEP: 60.0000 mg | ORAL_CAPSULE | Freq: Every day | ORAL | 3 refills | Status: DC

## 2017-01-16 NOTE — Telephone Encounter (Signed)
Called and spoke to pt.  I relayed the information and she expressed understanding.  She will begin taking 1/2 of the Celexa for 2 weeks and then stop.  Then she understands to start taking Cymbalta once a day.  I verified to send the script to [facility] in [place].

## 2017-01-16 NOTE — Telephone Encounter (Signed)
-----   Message from Benancio Deeds, MD sent at 01/16/2017  1:52 PM EST ----- Regarding: medication change Jan I spoke with mental health provider for this patient and recommend the following change - please ask patient to cut her dose of Celexa in half, take for 2 weeks, and then stop - when she stops Celexa, start Cymbalta (Duloxetine) 60mg  once daily  Can you let the patient know that is okay with this change and we think it may help her pain management.   Thanks

## 2017-01-16 NOTE — Patient Instructions (Addendum)
If you are age 60 or older, your body mass index should be between 23-30. Your Body mass index is 31.76 kg/m. If this is out of the aforementioned range listed, please consider follow up with your Primary Care Provider.  If you are age 40 or younger, your body mass index should be between 19-25. Your Body mass index is 31.76 kg/m. If this is out of the aformentioned range listed, please consider follow up with your Primary Care Provider.   You have been scheduled for an abdominal ultrasound at [facility] (1st floor of hospital) on Tuesday, 01-21-17 at 11:30am. Please arrive 15 minutes prior to your appointment for registration. Make certain not to have anything to eat or drink 6 hours prior to your appointment. Should you need to reschedule your appointment, please contact radiology at [phone]. This test typically takes about 30 minutes to perform.  Please continue taking Lialda and Omeprazole.  Dr. Adela Lank will reach out to [facility] regarding possible medication changes.   Thank you.

## 2017-01-16 NOTE — Progress Notes (Signed)
HPI :  61 year old female here for reassessment following issues:  Left sided UC - diagnosed in 2013. On Lialda, taking 2 tabs daily, tolerating it well. She is having one to 2 bowel movements per day that are formed. No blood in the stools. She has not had any flares of her colitis and she was last seen. Overall she is doing really well in this regard.  Chronic abdominal pain - see her prior workup as outlined below. Thought to have abdominal wall pain or referred pain from the lower back her left hip. She has tried gabapentin and topical capsaicin cream without benefit. Referred to pain management for trigger point injection, seen by Dr. Zack Smith, she did not have benefit after one injection but has not followed up. She states overall this is stable, she thinks in fact a little bit better since her last visit. Pain fluctuates but rated 5 attendant worst.  Right upper quadrant pain - this is a new discomfort that she is describing today. She states she has intermittent right upper quadrant pain, unclear if related to eating or not, she can't recall. It comes and goes but has been bothering more so recently. She asks about possibly being related to her gallbladder. She's not had a prior ultrasound of her gallbladder.   Dysphagia -  EGD done for dysphagia on 10/17/16 - 4cm hiatal hernia, irregular zline - biopsies negative, mild distal stenosis - dilated to 61mMercy Tiffi77-7Nelson County Health SysteBotswanamGala MurNormanGearldine BMetro Surgery CenterCarteret General HospitaleSpalding Endoscopy Center LLinwood81mDRose Ambulatory Surgery37-7Citrus Surgery CenteBotswanarGala MurNormanGearldine BGreeley Endoscopy CenterNorth Florida Regional Medical CentereBirmingham Va Medical CentLinwood47mDHammond Henr38-7Adventist Healthcare Washington Adventist HospitaBotswanalGala MurNormanGearldine BHea Gramercy Surgery Center PLLC Dba Hea Surgery CenterKindred Hospital - Las Vegas (Flamingo Campus)eGoleta Valley Cottage HospitLinwood6mDFloyd Medi20-7Encompass Health Rehabilitation Hospital Of AltoonBotswanaaGala MurNormanGearldine BYavapai Regional Medical CenterSt Lucie Medical CenterePam Rehabilitation Hospital Of VictorLinwood35mDOu Medi80-7Medical City Las ColinaBotswanasGala MurNormanGearldine BSt. David'S South Austin Medical CenterLake City Va Medical CentereSt. Mary'S Medical CentLinwood63mDOz31-7Inst Medico Del Norte Inc, Centro Medico Wilma N VazqueBotswanazGala MurNormanGearldine BSurgery Center Of Cliffside LLCBogalusa - Amg Specialty HospitaleOdessa Memorial Healthcare CentLinwood39mDLovelace Women73-7Laser Surgery Holding Company LtBotswanadGala MurNormanGearldine BCentegra Health System - Woodstock HospitalWayne General HospitaleHardy Wilson Memorial HospitLinwood40mDSouthern Eye Surgery 76-7Parkway Surgical Center LLBotswanaCGala MurNormanGearldine BPuget Sound Gastroetnerology At Kirklandevergreen Endo CtrGundersen Boscobel Area Hospital And ClinicseMidwest Eye Surgery CentLinwood84mDNorth Pinellas Surg28-7Center For Digestive Diseases And Cary Endoscopy CenteBotswanarGala MurNormanGearldine BAdventist Medical Center HanfordHamilton Ambulatory Surgery CentereUmass Memorial Medical Center - University CampLinwood83mDMeadowbrook Rehabilitatio33-7Punxsutawney Area HospitaBotswanalGala MurNormanGearldine BMemorial Medical CenterMid - Jefferson Extended Care Hospital Of BeaumonteKingman Regional Medical Center-Hualapai Mountain CampLinwood16mDJohn H Stroger J29-7Fairfield Medical CenteBotswanarGala MurNormanGearldine BCourt Endoscopy Center Of Frederick IncSanford Medical Center FargoeCox Medical Centers South HospitLinwood87mDLouisiana Extended Care Hospital Of14-7San Joaquin County P.H.FBotswana.Gala MurNormanGearldine BSurgery Center Of VieraIndiana University Health Blackford HospitalePoway Surgery CentLinwood2mDMemorialcare Long Beach Medi37-7Bluffton Regional Medical CenteBotswanarGala MurNormanGearldine BMid State Endoscopy CenterMarietta Advanced Surgery CentereVirtua Memorial Hospital Of Granger CounLinwood85mDBoone Hospi85-7Pocahontas Memorial HospitaBotswanalGala MurNormanGearldine BMercy Hospital WaldronThe Surgery Center At Northbay Vaca ValleyeGibson General HospitLinwood42mDWyoming Stat30-7Riverwalk Ambulatory Surgery CenteBotswanarGala MurNormanGearldine BNovamed Eye Surgery Center Of Maryville LLC Dba Eyes Of Illinois Surgery CenterMhp Medical CentereLovelace Westside HospitLinwood74mDBoca Raton Outpatient Surgery And Laser 22-7Eccs Acquisition Coompany Dba Endoscopy Centers Of Colorado SpringBotswanasGala MurNormanGearldine BBurbank Spine And Pain Surgery CenterSt Francis HospitaleShriners Hospital For ChildrLinwood90mDWilliam B Kessler Memoria39-7Mccamey HospitaBotswanalGala MurNormanGearldine BSmith County Memorial HospitalAssurance Health Psychiatric HospitaleDallas Endoscopy Center LLinwood31mDCullman Regional Medi22-7Ascension Via Christi Hospital Wichita St Teresa InBotswanacGala MurNormanGearldine BOur Lady Of PeaceBeltway Surgery Centers LLC Dba East Washington Surgery CentereSacred Heart Hospital On The GuLinwood20mDGrady Memoria96-7Hawaii Medical Center EasBotswanatGala MurNormanGearldine BThibodaux Endoscopy LLCBaptist Medical Park Surgery Center LLCeEast Alabama Medical CentLinwood54mDSt. Mark33-7Colusa Regional Medical CenteBotswanarGala MurNormanGearldine BSistersville General HospitalHavasu Regional Medical CentereCenter For Minimally Invasive SurgeLinwood49mDMiami Surgical 30-7Surgcenter Of Silver Spring LLBotswanaCGala MurNormanGearldine BSeven Hills Ambulatory Surgery CenterUva Kluge Childrens Rehabilitation CentereLone Star Endoscopy Center SouthlaLinwood39mDSt Joseph84-7Horsham CliniBotswanacGala MurNormanGearldine BCheyenne County HospitalPine Creek Medical CenterePromedica Herrick HospitLinwood50mDThe Eye Surgical Center Of Fort27-7Castle Rock Adventist HospitaBotswanalGala MurNormanGearldine BOcean State Endoscopy CenterIu Health East Washington Ambulatory Surgery Center LLCeVan Buren County HospitLinwood30mDOrthosouth Surgery Center Germ69-7Lakes Regional HealthcarBotswanaeGala MurNormanGearldine BEncompass Health Rehabilitation Hospital Of Cincinnati, LLCSelect Specialty Hospital - Grand RapidseLoch Raven Va Medical CentLinwood72mDTaylor Regiona3-7Trihealth Rehabilitation Hospital LLBotswanaCGala MurNormanGearldine BLewis And Clark Specialty HospitalEastern Maine Medical CentereHeart And Vascular Surgical Center LLinwood56mDSt Luke Community Hosp67-7Foothill Surgery Center LBotswanaPGala MurNormanGearldine BTexas General HospitalKansas Heart HospitaleFayetteville Ross Va Medical CentLinwood27mDGlen Endoscopy 59-7Christus Spohn Hospital AlicBotswanaeGala MurNormanGearldine BAdvanced Vision Surgery Center LLCBanner Estrella Surgery CentereColorado River Medical CentLinwood18mDBerkshire Medical Center - HiLLCr74-7Dartmouth Hitchcock Ambulatory Surgery CenteBotswanarGala MurNormanGearldine BToms River Ambulatory Surgical CenterDakota Gastroenterology LtdeThe Endoscopy Center EaLinwood70mDEndoscopy Center Of Kn42-7Jervey Eye Center LLBotswanaCGala MurNormanGearldine BDoctors Outpatient Surgery Center LLCKnoxville Surgery Center LLC Dba Tennessee Valley Eye CentereSells HospitLinwood57mDGalleria Surgery 41-7Vanderbilt University HospitaBotswanalGala MurNormanGearldine BCheyenne Va Medical CenterParis Regional Medical Center - North CampuseDelmarva Endoscopy Center LLinwood8136-Botswana7Gala MurNormanGearldine BCaldwell Medical CenterKindred Hospital Houston Northwe69mtCrane Memoria27-7Larue D Carter Memorial HospitaBotswanalGala MurNormanGearldine BBrownwood Regional Medical CenterBaptist Health FloydePoway Surgery CentLinwood86mDGainesville Urolo49-7Union Correctional Institute HospitaBotswanalGala MurNormanGearldine BReynolds Road Surgical Center LtdSurgicare Of Mobile LtdePremier Health Associates LLinwood61mDSaint Josephs Wayn61-7Peak Behavioral Health ServiceBotswanasGala MurNormanGearldine BBlaine Asc LLCFoothill Surgery Center LPeSt Cloud HospitLinwood57mDDoctors Medi69-7Endosurg Outpatient Center LLBotswanaCGala MurNormanGearldine BOsborne County Memorial HospitalSanford Clear Lake Medical CentereSutter Coast HospitLinwood Dibblesockalinaficial gastric ulcer and gastritis - negative for H pylori. She takes Advil for TMJ which is likely the cause of her gastric ulcer that was diminutive in size. She was placed on omeprazole 20 mg once daily and is tolerating well. She states her dysphagia had completely resolved following dilation at the last endoscopy.  Prior workup for abdominal pain has included: Negative CT abdomen / pelvis with contrast 07/14/15 CT abdomen / pelvis with contrast 12/19/14 - mild wall thickening of left colon, , left adnexal 1cm lesion (seen by  GYN for this) Colonoscopy 01/02/2015 - mild erythema up to 30cm, otherwise normal exam to TI - path shows mild inflammation with "mild architectural distortion Xray pelvis 09/2015 normal Ex-lap negative Flex sig done on 05/07/16 - active UC throughout left colon - chronic active colitis noted on path, Started on Lialda 4.8 gm / day as well as Rowasa enemas.    Past Medical History:  Diagnosis Date  . Abdominal wall pain   . Gastritis    chronic inactive  . Hiatal hernia   . Hypoglycemia   . Internal hemorrhoids   . Mild chronic ulcerative colitis (HCC)   . Multiple sclerosis (HCC)   . Obesity   . Panic disorder without agoraphobia   . Pure hypercholesterolemia      Past Surgical History:  Procedure Laterality Date  . arthroscopic knee surgery- left    . HYSTEROTOMY    . lower back     . ROTATOR CUFF REPAIR Right 2013  . TONSILLECTOMY AND ADENOIDECTOMY     Family History  Problem Relation Age of Onset  . Stroke Mother   . Heart disease Father   . Bladder Cancer Father   . Colon cancer Neg Hx   . Esophageal cancer Neg Hx   . Rectal cancer Neg Hx   . Stomach cancer Neg Hx    Social History   Tobacco Use  . Smoking status: Never Smoker  . Smokeless tobacco: Never Used  Substance Use Topics  . Alcohol use: No  Alcohol/week: 0.0 oz  . Drug use: No   Current Outpatient Medications  Medication Sig Dispense Refill  . buPROPion (WELLBUTRIN XL) 300 MG 24 hr tablet Take 300 mg by mouth every morning.  0  . citalopram (CELEXA) 20 MG tablet Take 20 mg by mouth daily.  1  . LORazepam (ATIVAN) 1 MG tablet TAKE 1 TO 4 TABLET BY MOUTH FOUR TIMES DAILY AS NEEDED FOR ANXIETY  1  . mesalamine (LIALDA) 1.2 g EC tablet Take 2 tablets (2.4 g total) by mouth daily. 60 tablet 3  . omeprazole (PRILOSEC) 20 MG capsule Take 1 capsule (20 mg total) by mouth daily. 90 capsule 1  . pravastatin (PRAVACHOL) 40 MG tablet Take 1 tablet (40 mg total) by mouth at bedtime. 90 tablet 3  . zolpidem  (AMBIEN) 10 MG tablet Take 10 mg by mouth at bedtime as needed. for sleep  1   No current facility-administered medications for this visit.    Allergies  Allergen Reactions  . Codeine Shortness Of Breath  . Oxycodone Other (See Comments)    confusion     Review of Systems: All systems reviewed and negative except where noted in HPI.   Lab Results  Component Value Date   WBC 5.4 12/16/2016   HGB 12.5 12/16/2016   HCT 38.4 12/16/2016   MCV 85.3 12/16/2016   PLT 164.0 12/16/2016    Lab Results  Component Value Date   CREATININE 0.71 12/16/2016   BUN 31 (H) 12/16/2016   NA 141 12/16/2016   K 3.8 12/16/2016   CL 105 12/16/2016   CO2 29 12/16/2016    Lab Results  Component Value Date   ALT 11 12/16/2016   AST 13 12/16/2016   ALKPHOS 89 12/16/2016   BILITOT 0.4 12/16/2016      Physical Exam: BP 110/60   Pulse 91   Ht 5\' 4"  (1.626 m)   Wt 185 lb (83.9 kg)   BMI 31.76 kg/m  Constitutional: Pleasant, female in no acute distress, sitting in wheelchair HEENT: Normocephalic and atraumatic. Conjunctivae are normal. No scleral icterus. Neck supple.  Cardiovascular: Normal rate, regular rhythm.  Pulmonary/chest: Effort normal and breath sounds normal. No wheezing, rales or rhonchi. Abdominal: Soft, nondistended, tenderness to palpation in the LLQ and RUQ. There are no masses palpable. No hepatomegaly. Extremities: no edema Lymphadenopathy: No cervical adenopathy noted. Neurological: Alert and oriented to person place and time. Skin: Skin is warm and dry. No rashes noted. Psychiatric: Normal mood and affect. Behavior is normal.   ASSESSMENT AND PLAN: 61 year old female here for reassessment of the following issues:  Chronic LLQ pain - I think this is abdominal wall / musculoskeletal in etiology, perhaps related to low back / hip pain as suggested by Dr. 67. Gabapentin and capcaisin has not helped. Had trigger point injection without benefit thus far. Dr. Katrinka Blazing  had suggested a trial of an epidural injection if pain persisted, I recommend she follow up with him for this. Otherwise given her chronic pain I think another modality of pain medication such as Cymbalta may be useful. I spoke with Katrinka Blazing of [facility] who manages her Celexa. She was in agreement with this. We will wean off Celexa over 2 weeks and then start Cymbalta 60 mg once daily. Hopefully over time this will provide some better pain management.  RUQ pain - intermittent, unclear if related to food intake. She's never had a prior right upper quadrant ultrasound to assess for gallstones and she  is asking for this today. We will order ultrasound of the gallbladder assess for gallstones. I'll let her know the results.  Ulcerative colitis - left sided - continues to do well on Lialda, tolerating it well. She has a relatively recent diagnosis which does not warrant surveillance colonoscopy yet. Will continue her present regimen, follow up yearly for her colitis or sooner with changes  Dysphagia - mild distal stenosis of esophagus dilated to 20 mm on EGD with resolution of symptoms. Follow-up as needed for this issue.  Gastric ulcer - small superficial ulceration noted incidentally during EGD. She is NSAIDs for pain, I suspect this is the cause. I placed her on omeprazole 20 mg once a day. I recommend she continue this as long as she is using NSAIDs for pain control.  I otherwise offered the patient a flu shot today and she declined  Ileene Patrick, MD [facility] Pager [phone]

## 2017-01-17 ENCOUNTER — Telehealth: Payer: Self-pay | Admitting: Gastroenterology

## 2017-01-17 NOTE — Telephone Encounter (Signed)
Patient called and stated she needed to let Dr.Armbruster know that she has had 2 injections instead of the previously discussed one, and one of the injections was in the lumbar area. FYI

## 2017-01-17 NOTE — Telephone Encounter (Signed)
Okay, thanks for letting me know

## 2017-01-17 NOTE — Telephone Encounter (Signed)
Routed to Dr. Armbruster. 

## 2017-01-20 ENCOUNTER — Telehealth: Payer: Self-pay | Admitting: Neurology

## 2017-01-20 NOTE — Telephone Encounter (Signed)
Patient called and needs to get a prescription for the hand controls in her Zenaida Niece. Thanks

## 2017-01-20 NOTE — Telephone Encounter (Signed)
Spoke with Pt, she asked for a simple letter indicating the need for hand controls due to the weakness in her legs.

## 2017-01-21 ENCOUNTER — Ambulatory Visit (HOSPITAL_COMMUNITY): Payer: Medicare Other

## 2017-01-23 ENCOUNTER — Ambulatory Visit (HOSPITAL_COMMUNITY)
Admission: RE | Admit: 2017-01-23 | Discharge: 2017-01-23 | Disposition: A | Discharge status: Home / Self Care | Payer: Medicare Other | Source: Ambulatory Visit | Attending: Gastroenterology | Admitting: Gastroenterology

## 2017-01-23 DIAGNOSIS — R1032 Left lower quadrant pain: Secondary | ICD-10-CM | POA: Diagnosis not present

## 2017-01-23 DIAGNOSIS — K5150 Left sided colitis without complications: Secondary | ICD-10-CM | POA: Diagnosis not present

## 2017-01-23 DIAGNOSIS — K259 Gastric ulcer, unspecified as acute or chronic, without hemorrhage or perforation: Secondary | ICD-10-CM | POA: Insufficient documentation

## 2017-01-23 DIAGNOSIS — R1011 Right upper quadrant pain: Secondary | ICD-10-CM | POA: Diagnosis not present

## 2017-01-23 DIAGNOSIS — R1310 Dysphagia, unspecified: Secondary | ICD-10-CM | POA: Diagnosis not present

## 2017-01-24 ENCOUNTER — Other Ambulatory Visit: Payer: Self-pay | Admitting: Family Medicine

## 2017-02-10 ENCOUNTER — Telehealth: Payer: Self-pay | Admitting: Gastroenterology

## 2017-02-10 NOTE — Telephone Encounter (Signed)
Left message for patient to call back  

## 2017-02-14 ENCOUNTER — Other Ambulatory Visit: Payer: Self-pay

## 2017-02-14 ENCOUNTER — Telehealth: Payer: Self-pay | Admitting: Gastroenterology

## 2017-02-14 MED ORDER — DULOXETINE HCL 60 MG PO CPEP: 60.0000 mg | ORAL_CAPSULE | Freq: Every day | ORAL | 3 refills | Status: DC

## 2017-02-14 NOTE — Progress Notes (Signed)
Refill cymbalta 

## 2017-02-14 NOTE — Telephone Encounter (Signed)
Called back and had to leave another vm to call back.

## 2017-02-14 NOTE — Telephone Encounter (Signed)
Patient has continued to have intermittent LLQ pain, associated more upon having a bowel movement. She states she has had the injections but they did not help. She denies any fever, change in bowel habits or blood in stool.

## 2017-02-17 NOTE — Telephone Encounter (Signed)
Patient has had an extensive evaluation for this issue, which I suspect is musculoskeletal.  She had been referred for trigger point injections, it is too bad these have not helped too much. She was supposed to be switched to Cymbalta about a month ago when I saw her. Has she been taking this? She should be on 60mg  once daily. This can take upwards of 6 weeks to provide benefit. Otherwise, we can try adding bentyl 10mg  q 8 hrs PRN if she has not tried this yet. Thanks

## 2017-02-17 NOTE — Telephone Encounter (Signed)
The pt states she will continue cymbalta 60 mg for a few more weeks and call if no response.

## 2017-03-05 ENCOUNTER — Ambulatory Visit (INDEPENDENT_AMBULATORY_CARE_PROVIDER_SITE_OTHER): Payer: Medicare Other

## 2017-03-05 ENCOUNTER — Ambulatory Visit: Payer: Medicare Other | Admitting: Family Medicine

## 2017-03-05 ENCOUNTER — Encounter: Payer: Self-pay | Admitting: Family Medicine

## 2017-03-05 VITALS — BP 116/68 | HR 93 | Temp 98.2°F | Ht 64.0 in | Wt 185.0 lb

## 2017-03-05 DIAGNOSIS — M79644 Pain in right finger(s): Secondary | ICD-10-CM

## 2017-03-05 DIAGNOSIS — E785 Hyperlipidemia, unspecified: Secondary | ICD-10-CM

## 2017-03-05 MED ORDER — DICLOFENAC SODIUM 75 MG PO TBEC: 75.0000 mg | DELAYED_RELEASE_TABLET | Freq: Two times a day (BID) | ORAL | 0 refills | Status: DC

## 2017-03-05 NOTE — Patient Instructions (Addendum)
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  Please start the voltaren.   You can also use ice 2-3 times daily to the area.  Let me know if worsening or not improving in 1-2 weeks.  Take care. Dr Jimmey Ralph

## 2017-03-05 NOTE — Progress Notes (Signed)
Radiologist review of xray agrees with mild arthritis. No acute abnormalities. No changes needed to treatment plan we discussed during our office visit.

## 2017-03-05 NOTE — Progress Notes (Signed)
    Subjective:  Deborah Rangel is a 62 y.o. female who presents today for same-day appointment with a chief complaint of right wrist pain.   HPI:  Right Wrist Pain, Acute Issue Started couple days ago.  Worsened over that time.  No trauma, falls, or other obvious precipitating events.  Pain mostly located along the radial aspect of her wrist.  She has a little bit of swelling to the area.  She has not tried any home treatments.  No weakness or numbness.  Pain is worse with movement of wrist and with movement of thumb.  No fevers or chills.  HLD, established problem, stable Recently started back on pravastatin.  She is done well with this without any side effects.  ROS: Per HPI  PMH: She reports that  has never smoked. she has never used smokeless tobacco. She reports that she does not drink alcohol or use drugs.  Objective:  Physical Exam: BP 116/68 (BP Location: Left Arm, Patient Position: Sitting, Cuff Size: Normal)   Pulse 93   Temp 98.2 F (36.8 C) (Oral)   Ht 5\' 4"  (1.626 m)   Wt 185 lb (83.9 kg)   SpO2 99%   BMI 31.76 kg/m   Gen: NAD, resting comfortably MSK: -Right hand: No deformities.  Tender to palpation along first CMC joint.  Finkelstein positive.  Full range of motion throughout.  Neurovascularly intact distally.  Normal thumb opposition strength.  Assessment/Plan:  Right wrist pain Likely secondary to arthritis at [facility] joint however she does have positive [facility] may have a component of de Quervain's tenosynovitis.  No acute abnormality seen on her x-ray today-we will await radiology read.  Discussed treatment options with patient.  We will start oral diclofenac for the next 2 weeks.  Also recommended placement into a thumb spica splint, however patient deferred.  Advised her to ice the area 2-3 times daily.  Return precautions reviewed.  If symptoms worsen, or do not improve, she will need referral to sports medicine for further management.  Lourena Simmonds. Katina Degree,  MD 03/05/2017 12:08 PM

## 2017-03-05 NOTE — Assessment & Plan Note (Signed)
Tolerating pravastatin well.  No side effects.  Repeat lipid panel in October.

## 2017-03-12 ENCOUNTER — Other Ambulatory Visit: Payer: Self-pay

## 2017-03-12 MED ORDER — DULOXETINE HCL 60 MG PO CPEP: 60.0000 mg | ORAL_CAPSULE | Freq: Every day | ORAL | 0 refills | Status: DC

## 2017-03-18 ENCOUNTER — Other Ambulatory Visit: Payer: Self-pay | Admitting: Family Medicine

## 2017-03-25 ENCOUNTER — Encounter: Payer: Self-pay | Admitting: Family Medicine

## 2017-03-27 ENCOUNTER — Other Ambulatory Visit: Payer: Self-pay

## 2017-03-28 ENCOUNTER — Other Ambulatory Visit: Payer: Self-pay

## 2017-03-28 MED ORDER — DICLOFENAC SODIUM 75 MG PO TBEC: 75.0000 mg | DELAYED_RELEASE_TABLET | Freq: Two times a day (BID) | ORAL | 0 refills | Status: DC

## 2017-04-06 ENCOUNTER — Other Ambulatory Visit: Payer: Self-pay | Admitting: Family Medicine

## 2017-04-07 NOTE — Telephone Encounter (Signed)
Ok with me. Please place any necessary orders. 

## 2017-04-07 NOTE — Telephone Encounter (Signed)
Ok to refill 

## 2017-04-15 ENCOUNTER — Telehealth: Payer: Self-pay | Admitting: Gastroenterology

## 2017-04-15 NOTE — Telephone Encounter (Signed)
Left message to call back  

## 2017-04-17 NOTE — Telephone Encounter (Signed)
We have tried multiple regimens for pain control, unfortunately none have worked. I have referred her to Dr. Ayesha Mohair for another approach for pain management, she can see him again if she wishes. If she hasn't tried Bentyl 10mg  q 8 hrs for cramps we can consider that, I think she has used it, but I don't feel strongly it will relieve her symptoms.

## 2017-04-17 NOTE — Telephone Encounter (Signed)
Called patient, had to leave another vm, asked her to contact office.

## 2017-04-17 NOTE — Telephone Encounter (Signed)
Spoke to patient, she states that with bowel movements she has LLQ pain, eventually subsides afterwards. Occasionally, admits that she will have LLQ pain not related to bm's, depends on how she moves.  She doesn't feel that the Cymbalta is working well. She denies any blood in stool, no fever.

## 2017-04-18 ENCOUNTER — Other Ambulatory Visit: Payer: Self-pay

## 2017-04-18 MED ORDER — DICYCLOMINE HCL 10 MG PO CAPS: 10.0000 mg | ORAL_CAPSULE | Freq: Three times a day (TID) | ORAL | 1 refills | Status: DC | PRN

## 2017-04-18 NOTE — Telephone Encounter (Signed)
Spoke to patient, she does not remember if she has tried Bentyl but is willing to give it a try. Rx sent to her pharmacy. Instructed to contact Dr. Herma Carson. Smith's office for possible other pain management treatments if this does not help.

## 2017-05-21 ENCOUNTER — Other Ambulatory Visit: Payer: Self-pay | Admitting: Gastroenterology

## 2017-05-28 ENCOUNTER — Other Ambulatory Visit: Payer: Self-pay

## 2017-05-28 NOTE — Progress Notes (Signed)
Received notification from [facility] that mesalamine not on pt's formulary.  Called [facility] in [place] to have them run thru as brand (Lialda) to see if that would be covered but they notified us that pt is not longer using them for prescriptions and is using a [facility] [place].  Letter from [facility] indicated alternatives would be Apriso Cap 0.375g or Delzicol Cap 400mg . Waiting to hear from pt regarding desire to switch pharmacies and if she is desiring an alternative to mesalamine tab 1.2gm.

## 2017-06-02 ENCOUNTER — Other Ambulatory Visit: Payer: Self-pay | Admitting: Gastroenterology

## 2017-06-02 NOTE — Telephone Encounter (Signed)
Yes okay to refill  

## 2017-06-02 NOTE — Telephone Encounter (Signed)
OK to refill

## 2017-06-05 ENCOUNTER — Other Ambulatory Visit: Payer: Self-pay

## 2017-06-05 ENCOUNTER — Telehealth: Payer: Self-pay | Admitting: Gastroenterology

## 2017-06-05 MED ORDER — DICYCLOMINE HCL 10 MG PO CAPS: 10.0000 mg | ORAL_CAPSULE | Freq: Three times a day (TID) | ORAL | 2 refills | Status: AC | PRN

## 2017-06-05 NOTE — Telephone Encounter (Signed)
Let patient know that the name of medication is Bentyl. She has moved to [place], [facility] I let her know that her [facility] can look [facility] and transfer medications, if they have a problem can contact our office.

## 2017-06-05 NOTE — Telephone Encounter (Signed)
[facility] [place] Phone number: [phone].

## 2017-06-05 NOTE — Telephone Encounter (Signed)
Pt moved to [place] and would like Bentyl Rx sent to [facility]. [facility] [address] in [place], [place].

## 2017-06-13 ENCOUNTER — Other Ambulatory Visit: Payer: Self-pay | Admitting: *Deleted

## 2017-06-13 MED ORDER — OMEPRAZOLE 20 MG PO CPDR: 20.0000 mg | DELAYED_RELEASE_CAPSULE | Freq: Every day | ORAL | 1 refills | Status: DC

## 2017-06-16 ENCOUNTER — Ambulatory Visit: Payer: Medicare Other | Admitting: Neurology

## 2017-06-30 ENCOUNTER — Telehealth: Payer: Self-pay | Admitting: Gastroenterology

## 2017-07-01 MED ORDER — MESALAMINE ER 0.375 G PO CP24: 1.5000 g | ORAL_CAPSULE | Freq: Every day | ORAL | 0 refills | Status: AC

## 2017-07-01 NOTE — Telephone Encounter (Signed)
Called and spoke to pt.  She was grateful for 90 day script. [facility] to [facility] [place].

## 2017-07-01 NOTE — Telephone Encounter (Signed)
Thanks Jan. If her insurance does not cover Lialda I would recommend one of the following: Apriso: 1.5 g once daily in the morning Delzicol: 1.6 g in 2 to 4 divided doses  I'm okay with writing for one of these scripts until she can get plugged into a new GI's office if she has run out of Lialda. We can try Apriso 1.5gm once daily. Can give her a 90 day supply. Thanks

## 2017-07-06 ENCOUNTER — Other Ambulatory Visit: Payer: Self-pay | Admitting: Family Medicine

## 2017-08-20 ENCOUNTER — Other Ambulatory Visit: Payer: Self-pay | Admitting: Family Medicine

## 2017-11-12 IMAGING — US US TRANSVAGINAL NON-OB
1 series · 15 of 25 positions shown · non-contrast
Comparison: None

CLINICAL DATA: Left ovarian mass, status post hysterectomy and
right oophorectomy

EXAM:
TRANSABDOMINAL AND TRANSVAGINAL ULTRASOUND OF PELVIS
TECHNIQUE: Both transabdominal and transvaginal ultrasound examinations of the
pelvis were performed. Transabdominal technique was performed for
global imaging of the pelvis including uterus, ovaries, adnexal
regions, and pelvic cul-de-sac. It was necessary to proceed with
endovaginal exam following the transabdominal exam to visualize the
left ovary.

[Series 1: us transvaginal non-ob · 44 acquisitions, 15 frames shown]
[im 1/44]
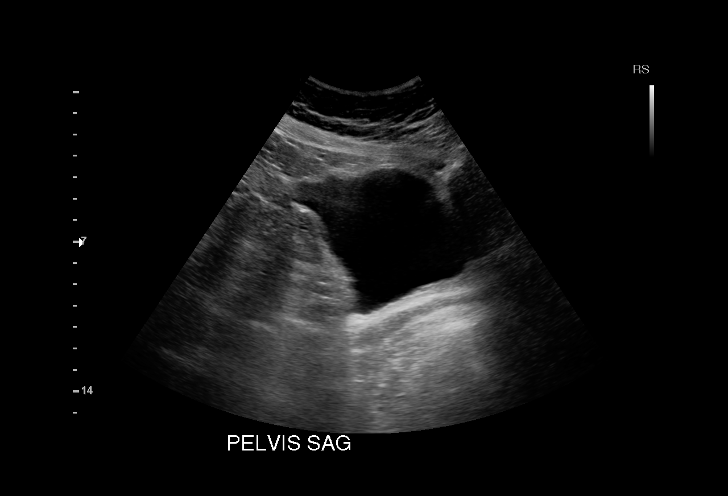
[im 4/44]
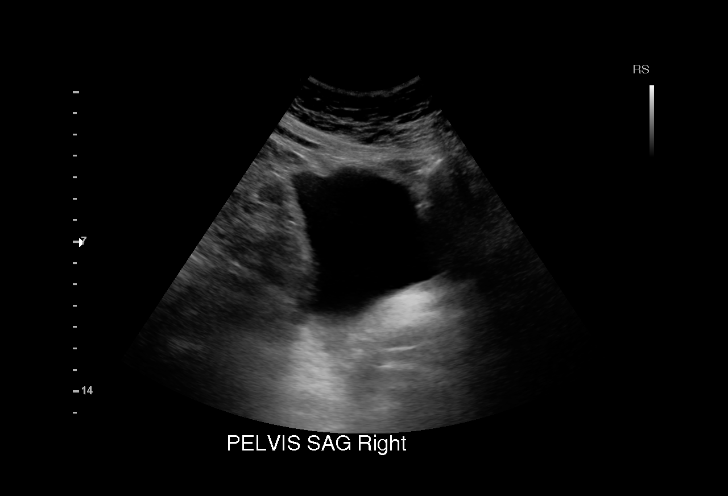
[im 8/44]
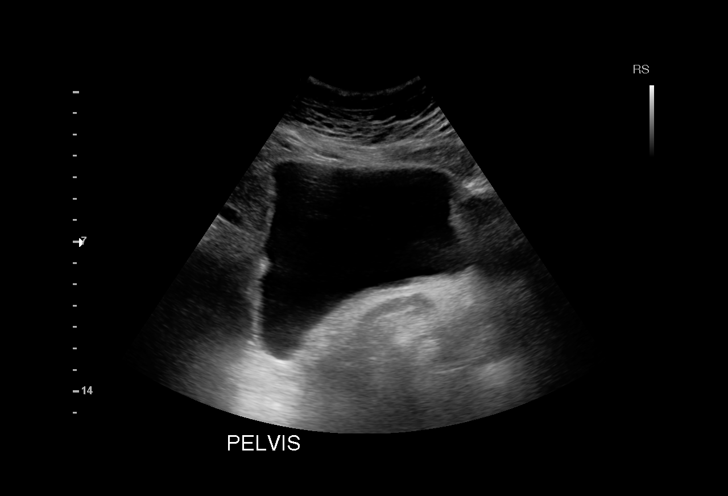
[im 9/44]
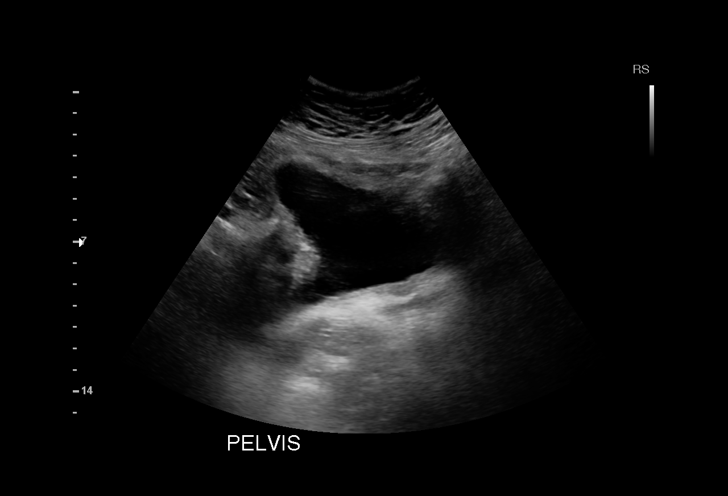
[im 13/44]
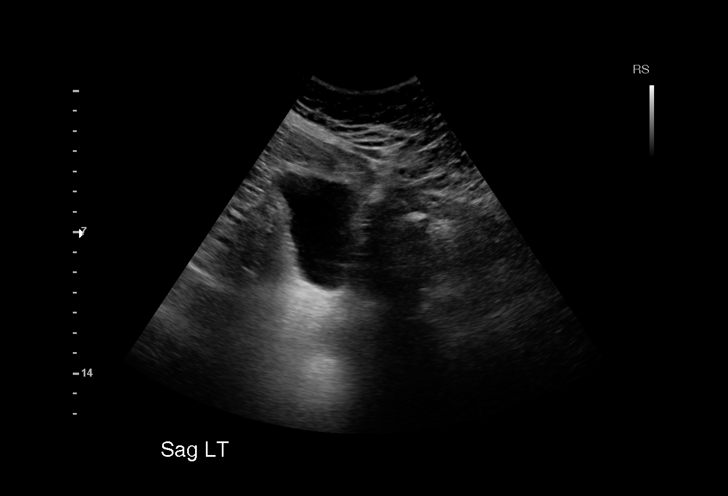
[im 17/44]
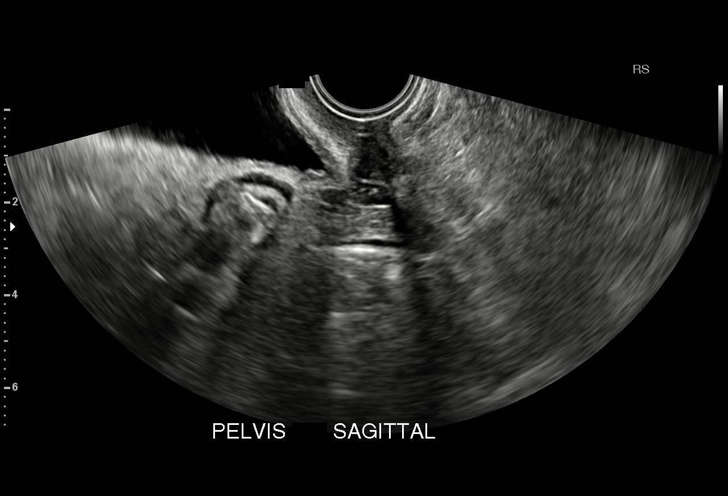
[im 18/44]
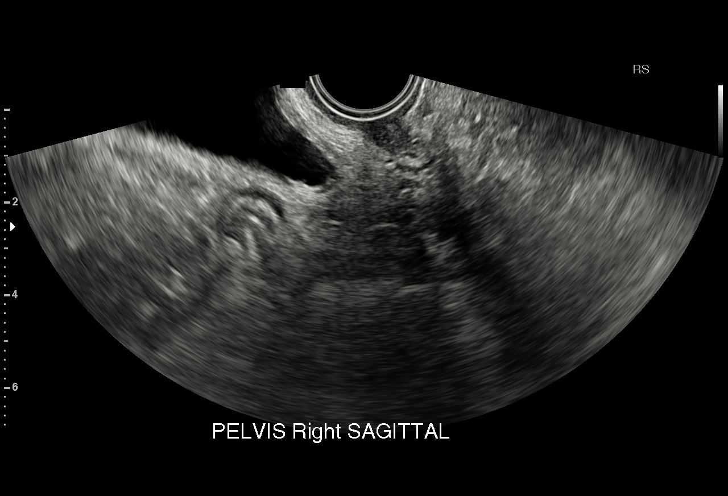
[im 22/44]
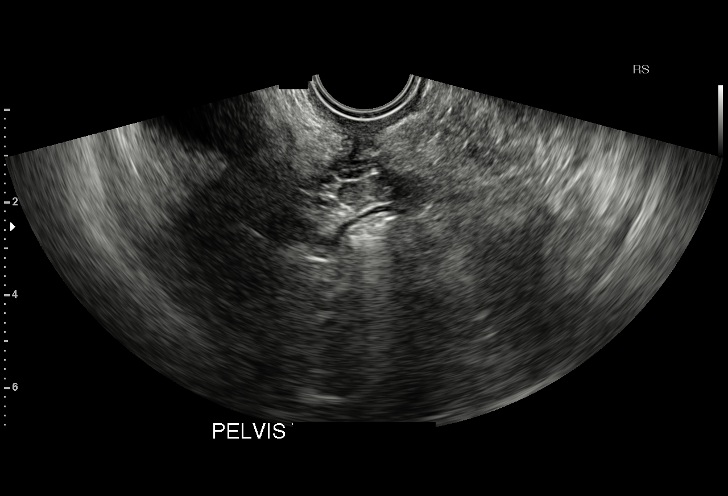
[im 26/44]
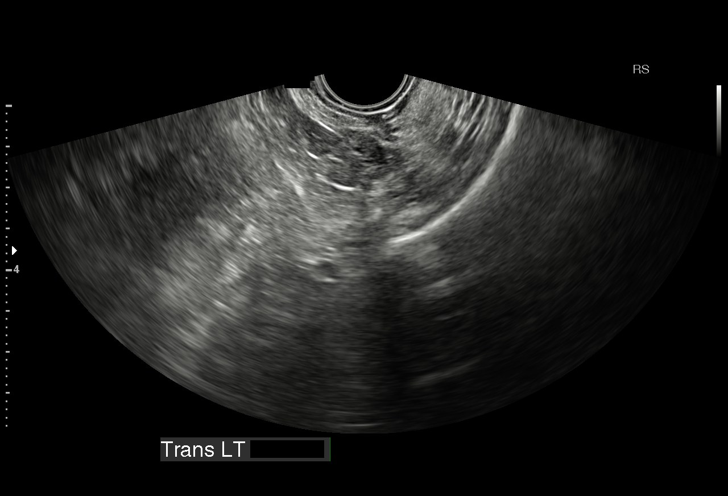
[im 27/44]
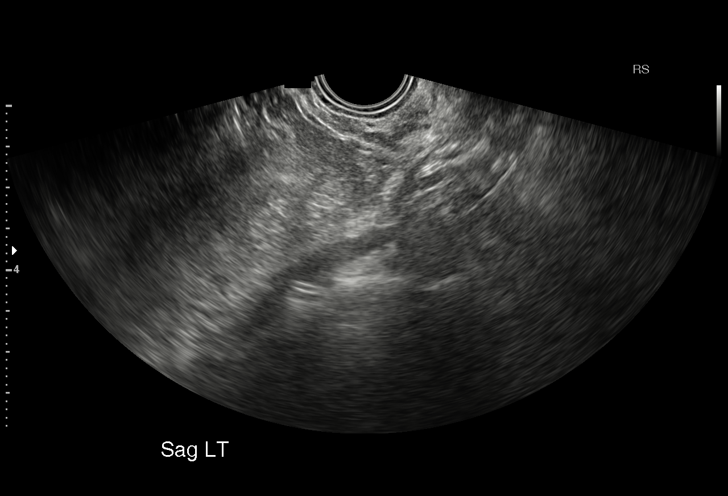
[im 31/44]
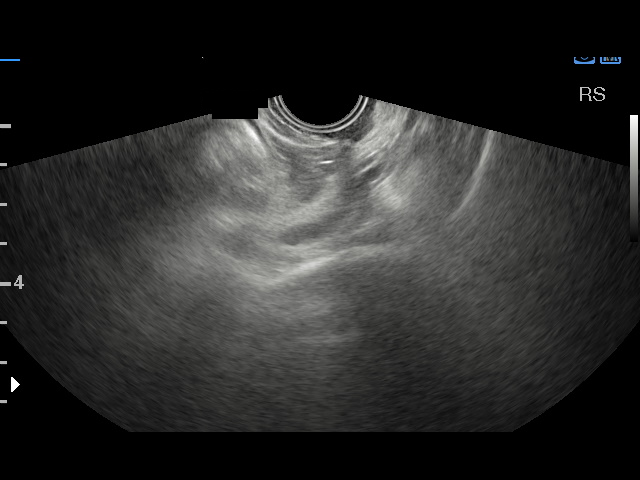
[im 35/44]
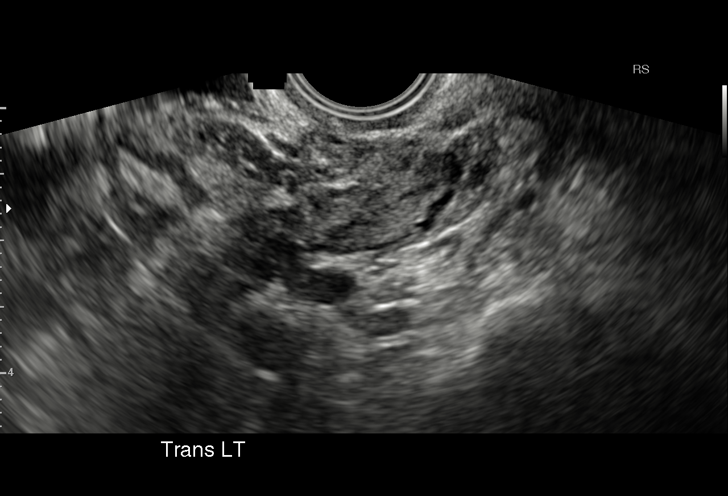
[im 36/44]
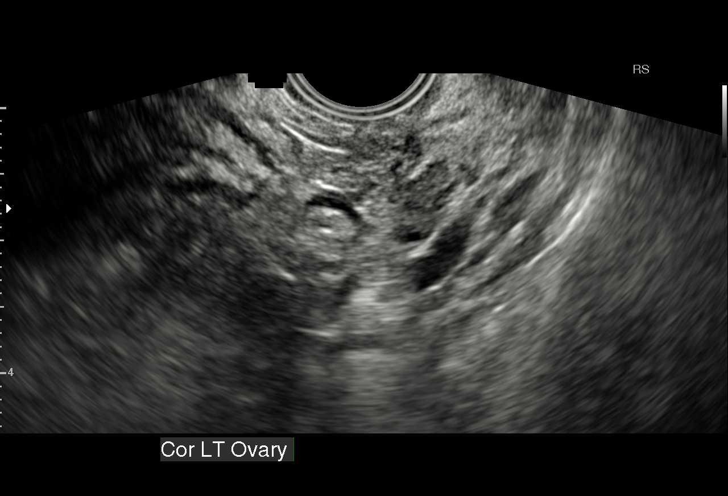
[im 40/44]
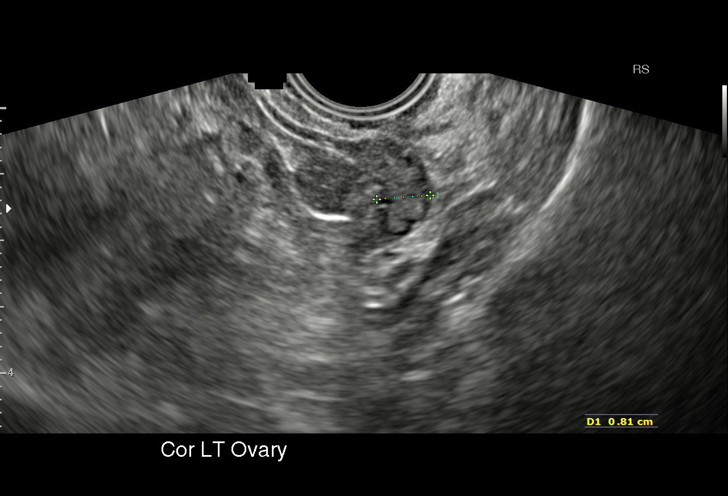
[im 44/44]
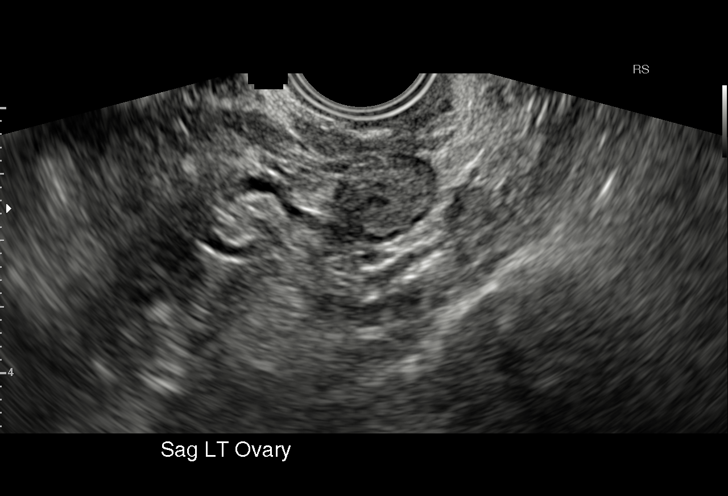

[15 of 25 positions shown; findings below may reference images not displayed]

FINDINGS: Uterus

Surgically absent.

Right ovary

Surgically absent.

Left ovary

Measurements: 1.5 x 1.0 x 0.9 cm. Poorly visualized. Normal
appearance/no adnexal mass.

Other findings

No abnormal free fluid.
IMPRESSION: Status post hysterectomy and right oophorectomy.

Left ovary is within normal limits.

## 2017-12-16 ENCOUNTER — Encounter: Payer: Medicare Other | Admitting: Family Medicine

## 2017-12-25 ENCOUNTER — Other Ambulatory Visit: Payer: Self-pay | Admitting: Gastroenterology

## 2017-12-25 NOTE — Telephone Encounter (Signed)
The pt has moved to [place]. I spoke with her husband, Ron. He will make sure that she is aware that she will need to establish care there and that you will only refill this 90 day supply of Omeprazole once.

## 2017-12-25 NOTE — Telephone Encounter (Signed)
Dr. Adela Lank, Do you want to keep managing this pts PPI? It looks like she has moved to [place]. A refill request for Omeprazole was faxed from there today.

## 2017-12-25 NOTE — Telephone Encounter (Signed)
Deborah Rangel could you call her and see if she has moved there or just traveling through. If she has moved there can give her a 90 day supply but she will need to follow up and establish with another provider for additional refills. Thanks

## 2017-12-26 ENCOUNTER — Telehealth: Payer: Self-pay | Admitting: Gastroenterology

## 2017-12-26 NOTE — Telephone Encounter (Signed)
Pt's husband called to inform that pt has a new GI doctor in [place], where they live now, who is taking care of her medications and care.

## 2017-12-26 NOTE — Telephone Encounter (Signed)
Okay thanks for letting me know

## 2017-12-26 NOTE — Telephone Encounter (Signed)
FYI

## 2020-06-01 ENCOUNTER — Encounter: Payer: Self-pay | Admitting: Gastroenterology
